# Patient Record
Sex: Male | Born: 2000 | Race: White | Hispanic: No | Marital: Single | Smoking: Current every day smoker
Health system: Southern US, Community
[De-identification: ages and names within clinical notes are randomized; demographics above are authoritative.]

## PROBLEM LIST (undated history)

## (undated) DIAGNOSIS — F909 Attention-deficit hyperactivity disorder, unspecified type: Secondary | ICD-10-CM

## (undated) DIAGNOSIS — H9325 Central auditory processing disorder: Secondary | ICD-10-CM

---
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
## 2021-01-22 ENCOUNTER — Other Ambulatory Visit: Payer: Self-pay

## 2021-01-22 ENCOUNTER — Emergency Department (HOSPITAL_COMMUNITY)
Admission: EM | Admit: 2021-01-22 | Discharge: 2021-01-22 | Disposition: A | Discharge status: Home / Self Care | Payer: Medicaid Other | Attending: Emergency Medicine | Admitting: Emergency Medicine

## 2021-01-22 ENCOUNTER — Encounter (HOSPITAL_COMMUNITY): Payer: Self-pay

## 2021-01-22 ENCOUNTER — Emergency Department (HOSPITAL_COMMUNITY): Payer: Medicaid Other

## 2021-01-22 DIAGNOSIS — R1031 Right lower quadrant pain: Secondary | ICD-10-CM

## 2021-01-22 DIAGNOSIS — K3580 Unspecified acute appendicitis: Secondary | ICD-10-CM | POA: Insufficient documentation

## 2021-01-22 DIAGNOSIS — Z20822 Contact with and (suspected) exposure to covid-19: Secondary | ICD-10-CM | POA: Insufficient documentation

## 2021-01-22 LAB — COMPREHENSIVE METABOLIC PANEL
ALT: 18 U/L (ref 0–44)
AST: 23 U/L (ref 15–41)
Albumin: 4.8 g/dL (ref 3.5–5.0)
Alkaline Phosphatase: 78 U/L (ref 38–126)
Anion gap: 7 (ref 5–15)
BUN: 10 mg/dL (ref 6–20)
CO2: 28 mmol/L (ref 22–32)
Calcium: 9.5 mg/dL (ref 8.9–10.3)
Chloride: 101 mmol/L (ref 98–111)
Creatinine, Ser: 0.87 mg/dL (ref 0.61–1.24)
GFR, Estimated: >60 mL/min (ref >60)
Glucose, Bld: 127 mg/dL — ABNORMAL HIGH (ref 70–99)
Potassium: 3.8 mmol/L (ref 3.5–5.1)
Sodium: 136 mmol/L (ref 135–145)
Total Bilirubin: 1 mg/dL (ref 0.3–1.2)
Total Protein: 7.8 g/dL (ref 6.5–8.1)

## 2021-01-22 LAB — URINALYSIS, ROUTINE W REFLEX MICROSCOPIC
Bacteria, UA: NONE SEEN
Bilirubin Urine: NEGATIVE
Glucose, UA: NEGATIVE mg/dL
Hgb urine dipstick: NEGATIVE
Ketones, ur: NEGATIVE mg/dL
Leukocytes,Ua: NEGATIVE
Nitrite: NEGATIVE
Protein, ur: 30 mg/dL — AB
Specific Gravity, Urine: 1.024 (ref 1.005–1.030)
pH: 7 (ref 5.0–8.0)

## 2021-01-22 LAB — RESP PANEL BY RT-PCR (FLU A&B, COVID) ARPGX2
Influenza A by PCR: NEGATIVE
Influenza B by PCR: NEGATIVE
SARS Coronavirus 2 by RT PCR: NEGATIVE

## 2021-01-22 LAB — LIPASE, BLOOD: Lipase: 27 U/L (ref 11–51)

## 2021-01-22 LAB — CBC
HCT: 45.2 % (ref 39.0–52.0)
Hemoglobin: 15 g/dL (ref 13.0–17.0)
MCH: 29.4 pg (ref 26.0–34.0)
MCHC: 33.2 g/dL (ref 30.0–36.0)
MCV: 88.6 fL (ref 80.0–100.0)
Platelets: 203 10*3/uL (ref 150–400)
RBC: 5.1 MIL/uL (ref 4.22–5.81)
RDW: 12.4 % (ref 11.5–15.5)
WBC: 9.6 10*3/uL (ref 4.0–10.5)
nRBC: 0 % (ref 0.0–0.2)

## 2021-01-22 MED ORDER — SODIUM CHLORIDE 0.9 % IV BOLUS
500.0000 mL | Freq: Once | INTRAVENOUS | Status: AC
  Administered 2021-01-22: 500 mL via INTRAVENOUS

## 2021-01-22 MED ORDER — AMOXICILLIN-POT CLAVULANATE 875-125 MG PO TABS: 1.0000 | ORAL_TABLET | Freq: Two times a day (BID) | ORAL | 0 refills | Status: AC

## 2021-01-22 MED ORDER — IOHEXOL 300 MG/ML  SOLN
100.0000 mL | Freq: Once | INTRAMUSCULAR | Status: AC | PRN
  Administered 2021-01-22: 75 mL via INTRAVENOUS

## 2021-01-22 MED ORDER — DICYCLOMINE HCL 10 MG PO CAPS
10.0000 mg | ORAL_CAPSULE | Freq: Once | ORAL | Status: AC
  Administered 2021-01-22: 10 mg via ORAL
  Filled 2021-01-22: qty 1

## 2021-01-22 NOTE — ED Triage Notes (Signed)
Pt brought in by [facility] with c/o RLQ abdominal pain. Denies n/v. EMS reports pt is anxious and VSS.

## 2021-01-22 NOTE — Discharge Instructions (Addendum)
Your lab work looks reassuring, your imaging reveals possible appendicitis.  It is recommended by our general surgeon that we would antibiotics.  I have started you on Augmentin please take as prescribed.  I recommend a bland diet please read information above please read  You may follow-up with Dr. Lovell Sheehan with general surgery for further evaluation if needed contact info is above  I want you to come back to the emergency department if you develop fevers, chills, severe abdominal pain, uncontrolled nausea, vomiting as these symptoms concerning for appendicitis.  Please allow the medication 24 hours to work if after 24 hours you have these symptoms please come back.

## 2021-01-22 NOTE — ED Triage Notes (Signed)
Pt to er, pt states that he is here for some abd pain, pt c/o RLQ pain, states that the pain started this am after he woke up, states that the pain is better when he hunches over.  Pt denies vomiting or diarrhea, states that he has had a similar episode and it was gas.

## 2021-01-22 NOTE — ED Provider Notes (Signed)
[facility] EMERGENCY DEPARTMENT Provider Note   CSN: [id number] Arrival date & time: 01/22/21  1018     History Chief Complaint  Patient presents with  . Abdominal Pain    Lee Ingram is a 20 y.o. male.  HPI   Patient with no significant medical history presents to the emergency department with chief complaint of right lower quadrant pain, he endorses pain started suddenly this morning, describes it as a sharp sensation in his abdomen, does not radiate, associated with nausea but no vomiting, denies urinary symptoms, constipation or diarrhea.  He denies food making the pain  worse, he tried to go to work today unfortunately pain got unbearable and come here to the emergency department.  He denies systemic infection like fevers or chills, has no significant abdominal history, has never had this in the past.  Patient denies  alleviating factors.  Patient last ate yesterday, is tolerating liquids.  Patient denies headaches, fevers, chills, shortness of breath, chest pain,  vomiting, diarrhea, worsening pedal edema.  Past Medical History:  Diagnosis Date  . ADHD   . Auditory processing disorder     There are no problems to display for this patient.   History reviewed. No pertinent surgical history.     History reviewed. No pertinent family history.  Social History   Tobacco Use  . Smoking status: Never Smoker  . Smokeless tobacco: Never Used  Vaping Use  . Vaping Use: Every day  Substance Use Topics  . Alcohol use: Never  . Drug use: Never    Home Medications Prior to Admission medications   Medication Sig Start Date End Date Taking? Authorizing Provider  amoxicillin-clavulanate (AUGMENTIN) 875-125 MG tablet Take 1 tablet by mouth every 12 (twelve) hours for 5 days. 01/22/21 01/27/21 Yes Marcello Fennel, PA-C    Allergies    Patient has no known allergies.  Review of Systems   Review of Systems  Constitutional: Negative for chills and fever.  HENT:  Negative for congestion and sore throat.   Respiratory: Negative for shortness of breath.   Cardiovascular: Negative for chest pain.  Gastrointestinal: Positive for abdominal pain and nausea. Negative for constipation, diarrhea and vomiting.  Genitourinary: Negative for dysuria, enuresis and flank pain.  Musculoskeletal: Negative for back pain.  Skin: Negative for rash.  Neurological: Negative for headaches.  Hematological: Does not bruise/bleed easily.    Physical Exam Updated Vital Signs BP 119/70 (BP Location: Left Arm)   Pulse (!) 114   Temp 99 F (37.2 C) (Oral)   Resp 18   Ht $R'6\' 1"'sP$  (1.854 m)   Wt 66.6 kg   SpO2 100%   BMI 19.37 kg/m   Physical Exam Vitals and nursing note reviewed.  Constitutional:      General: He is not in acute distress.    Appearance: He is not ill-appearing.  HENT:     Head: Normocephalic and atraumatic.     Nose: No congestion.  Eyes:     Conjunctiva/sclera: Conjunctivae normal.  Cardiovascular:     Rate and Rhythm: Normal rate and regular rhythm.     Pulses: Normal pulses.     Heart sounds: No murmur heard. No friction rub. No gallop.   Pulmonary:     Effort: No respiratory distress.     Breath sounds: No wheezing, rhonchi or rales.  Abdominal:     Palpations: Abdomen is soft.     Tenderness: There is abdominal tenderness. There is no right CVA tenderness, left CVA tenderness  or guarding.     Comments: Patient's abdomen is visualized, is nondistended, normoactive bowel sounds, dull to percussion, he is notably tender in the right lower quadrant, positive McBurney point, negative guarding, rebound tenderness, no CVA tenderness.  Skin:    General: Skin is warm and dry.  Neurological:     Mental Status: He is alert.  Psychiatric:        Mood and Affect: Mood normal.     ED Results / Procedures / Treatments   Labs (all labs ordered are listed, but only abnormal results are displayed) Labs Reviewed  COMPREHENSIVE METABOLIC PANEL -  Abnormal; Notable for the following components:      Result Value   Glucose, Bld 127 (*)    All other components within normal limits  URINALYSIS, ROUTINE W REFLEX MICROSCOPIC - Abnormal; Notable for the following components:   Protein, ur 30 (*)    All other components within normal limits  RESP PANEL BY RT-PCR (FLU A&B, COVID) ARPGX2  LIPASE, BLOOD  CBC    EKG None  Radiology CT ABDOMEN PELVIS W CONTRAST  Result Date: 01/22/2021 CLINICAL DATA:  Onset mid abdominal and right lower quadrant pain this morning. No known injury. EXAM: CT ABDOMEN AND PELVIS WITH CONTRAST TECHNIQUE: Multidetector CT imaging of the abdomen and pelvis was performed using the standard protocol following bolus administration of intravenous contrast. CONTRAST:  75 mL OMNIPAQUE IOHEXOL 300 MG/ML  SOLN COMPARISON:  None. FINDINGS: Lower chest: Lung bases clear.  No pleural or pericardial effusion. Hepatobiliary: No focal liver abnormality is seen. No gallstones, gallbladder wall thickening, or biliary dilatation. Pancreas: Unremarkable. No pancreatic ductal dilatation or surrounding inflammatory changes. Spleen: Normal in size without focal abnormality. Adrenals/Urinary Tract: Adrenal glands are unremarkable. Kidneys are normal, without renal calculi, focal lesion, or hydronephrosis. Bladder is unremarkable. Stomach/Bowel: The appendix is at the upper limits of normal in diameter at 0.6 cm and there is mild stranding about the appendix worrisome for early appendicitis. The stomach and small and large bowel appear normal. Vascular/Lymphatic: No significant vascular findings are present. No enlarged abdominal or pelvic lymph nodes. Reproductive: Prostate is unremarkable. Other: None. Musculoskeletal: No acute or focal abnormality. IMPRESSION: Findings are worrisome but not definitive for early appendicitis. No abscess or perforation. The exam is otherwise negative. Electronically Signed   By: Inge Rise M.D.   On:  01/22/2021 14:34    Procedures Procedures   Medications Ordered in ED Medications  dicyclomine (BENTYL) capsule 10 mg (10 mg Oral Given 01/22/21 1342)  sodium chloride 0.9 % bolus 500 mL (500 mLs Intravenous New Bag/Given 01/22/21 1339)  iohexol (OMNIPAQUE) 300 MG/ML solution 100 mL (75 mLs Intravenous Contrast Given 01/22/21 1404)    ED Course  I have reviewed the triage vital signs and the nursing notes.  Pertinent labs & imaging results that were available during my care of the patient were reviewed by me and considered in my medical decision making (see chart for details).    MDM Rules/Calculators/A&P                         Initial impression-patient presents with right lower quadrant, he is alert, does not appear in acute distress, vital signs notable for tachycardia.  Concern for appendicitis, will obtain basic lab work-up, provide patient with fluids, Bentyl, CT scan and reassess.  Work-up-CBC unremarkable, CMP shows hyperglycemia 127, UA negative for nitrates, leukocytes, hematuria, lipase 27.  CT abdomen pelvis reveals findings worrisome for  but not definitive for early appendicitis no abscess or perforation present.  Reassessment patient reassessed after fluids and Bentyl, states he is feeling better, has no complaints at this time.  Updated him on findings for possible appendicitis.  Will consult with general surgery for further recommendations.  Patient and mother updated on plans, they are agreeable to this, all questions were answered and they are agreeable for discharge at this time.  Consult spoke with Dr. Arnoldo Morale of general surgery, he reviewed imaging and feels patient could be managed with antibiotics on an outpatient setting.  He recommends Augmentin with strict return precautions.  If symptoms worsen he should come back for removal.  He can follow-up with his office in 5 days time if needed.  Rule out-low suspicion for systemic infection as patient is  nontoxic-appearing, no leukocytosis noted on CBC.  Patient is noted to be tachycardic, I suspect this is secondary due to pain as well as whitecoat syndrome as he states he feels nervous being in the emergency department.  Low suspicion for liver or gallbladder abnormality as patient has no right upper quadrant pain, liver enzymes and alk phos within normal limits.  Low suspicion for ruptured stomach ulcer as abdomen is nondistended, dull to percussion, no peritoneal sign present my exam.  Low suspicion for diverticulitis as history is atypical, CT imaging is negative for this.  Plan-  1.  Right lower quadrant pain-suspect secondary due to possible appendicitis, will start him on antibiotics, provide with strict return precautions, follow-up with general surgery if needed.  Vital signs have remained stable, no indication for hospital admission.  Patient discussed with attending and they agreed with assessment and plan.  Patient given at home care as well strict return precautions.  Patient verbalized that they understood agreed to said plan.   Final Clinical Impression(s) / ED Diagnoses Final diagnoses:  Right lower quadrant abdominal pain  Acute appendicitis, unspecified acute appendicitis type    Rx / DC Orders ED Discharge Orders         Ordered    amoxicillin-clavulanate (AUGMENTIN) 875-125 MG tablet  Every 12 hours        01/22/21 1509           Marcello Fennel, PA-C 01/22/21 1603    Milton Ferguson, MD 01/23/21 [phone]

## 2021-02-24 ENCOUNTER — Other Ambulatory Visit: Payer: Self-pay

## 2021-02-24 ENCOUNTER — Encounter (HOSPITAL_COMMUNITY): Payer: Self-pay | Admitting: Emergency Medicine

## 2021-02-24 ENCOUNTER — Emergency Department (HOSPITAL_COMMUNITY)
Admission: EM | Admit: 2021-02-24 | Discharge: 2021-02-24 | Disposition: A | Discharge status: Home / Self Care | Payer: Medicaid Other | Attending: Emergency Medicine | Admitting: Emergency Medicine

## 2021-02-24 ENCOUNTER — Emergency Department (HOSPITAL_COMMUNITY): Payer: Medicaid Other

## 2021-02-24 DIAGNOSIS — Z23 Encounter for immunization: Secondary | ICD-10-CM | POA: Insufficient documentation

## 2021-02-24 DIAGNOSIS — L089 Local infection of the skin and subcutaneous tissue, unspecified: Secondary | ICD-10-CM | POA: Diagnosis not present

## 2021-02-24 DIAGNOSIS — S6992XA Unspecified injury of left wrist, hand and finger(s), initial encounter: Secondary | ICD-10-CM | POA: Diagnosis present

## 2021-02-24 DIAGNOSIS — S61221A Laceration with foreign body of left index finger without damage to nail, initial encounter: Secondary | ICD-10-CM | POA: Diagnosis not present

## 2021-02-24 DIAGNOSIS — Y240XXA Airgun discharge, undetermined intent, initial encounter: Secondary | ICD-10-CM | POA: Diagnosis not present

## 2021-02-24 DIAGNOSIS — R000 Tachycardia, unspecified: Secondary | ICD-10-CM | POA: Insufficient documentation

## 2021-02-24 MED ORDER — LIDOCAINE HCL (PF) 1 % IJ SOLN
5.0000 mL | Freq: Once | INTRAMUSCULAR | Status: AC
  Administered 2021-02-24: 5 mL
  Filled 2021-02-24: qty 30

## 2021-02-24 MED ORDER — CEPHALEXIN 500 MG PO CAPS: 500.0000 mg | ORAL_CAPSULE | Freq: Four times a day (QID) | ORAL | 0 refills | Status: AC

## 2021-02-24 MED ORDER — TETANUS-DIPHTH-ACELL PERTUSSIS 5-2.5-18.5 LF-MCG/0.5 IM SUSY
0.5000 mL | PREFILLED_SYRINGE | Freq: Once | INTRAMUSCULAR | Status: AC
  Administered 2021-02-24: 0.5 mL via INTRAMUSCULAR
  Filled 2021-02-24: qty 0.5
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
## 2021-02-24 NOTE — ED Provider Notes (Signed)
[facility] EMERGENCY DEPARTMENT Provider Note   CSN: [id number] Arrival date & time: 02/24/21  1502     History Chief Complaint  Patient presents with   Finger Injury    [facility] is a 20 y.o. male.  HPI  Patient with no significant medical history presents to the emergency department with chief complaint of left index finger pain.  Patient states today he was playing with his CO2 powered pellet gun and accidentally shot the distal aspect of the second digit of his left hand.  He endorses severe pain after the incident and was able to control the bleeding with direct pressure.  He is unsure whether or not the metal pellet is on his finger, he states he has full range of motion in all of his joints his finger, denies paresthesia or weakness in that finger, he is unsure when his last tetanus shot was.  Patient denies alleviating factors.  Patient denies headaches, fevers, chills, shortness of breath or chest pain.  Past Medical History:  Diagnosis Date   ADHD    Auditory processing disorder     There are no problems to display for this patient.   History reviewed. No pertinent surgical history.     History reviewed. No pertinent family history.  Social History   Tobacco Use   Smoking status: Never   Smokeless tobacco: Never  Vaping Use   Vaping Use: Every day  Substance Use Topics   Alcohol use: Never   Drug use: Never    Home Medications Prior to Admission medications   Medication Sig Start Date End Date Taking? Authorizing Provider  cephALEXin (KEFLEX) 500 MG capsule Take 1 capsule (500 mg total) by mouth 4 (four) times daily for 7 days. 02/24/21 03/03/21 Yes Carroll Sage, PA-C    Allergies    Patient has no known allergies.  Review of Systems   Review of Systems  Constitutional:  Negative for chills and fever.  HENT:  Negative for congestion.   Respiratory:  Negative for shortness of breath.   Cardiovascular:  Negative for chest pain.   Gastrointestinal:  Negative for abdominal pain.  Genitourinary:  Negative for enuresis.  Musculoskeletal:  Negative for back pain.  Skin:  Positive for wound. Negative for rash.       Puncture wound in the second left digit of the hand.  Neurological:  Negative for dizziness.  Hematological:  Does not bruise/bleed easily.   Physical Exam Updated Vital Signs BP 119/80   Pulse 89   Temp 99 F (37.2 C) (Oral)   Resp 18   Ht 6\' 1"  (1.854 m)   Wt 66.7 kg   SpO2 99%   BMI 19.39 kg/m   Physical Exam Vitals and nursing note reviewed.  Constitutional:      General: He is not in acute distress.    Appearance: Normal appearance. He is not ill-appearing or diaphoretic.  HENT:     Head: Normocephalic and atraumatic.     Nose: No congestion or rhinorrhea.  Eyes:     General:        Right eye: No discharge.        Left eye: No discharge.     Conjunctiva/sclera: Conjunctivae normal.  Cardiovascular:     Rate and Rhythm: Regular rhythm. Tachycardia present.  Pulmonary:     Effort: Pulmonary effort is normal.  Musculoskeletal:     Cervical back: Neck supple.     Right lower leg: No edema.  Left lower leg: No edema.     Comments: Patient's left hand was visualized he has a noted puncture wound on the second left digit distal to the DIP joint on the palmar aspect.  It was hemodynamically stable, no surrounding erythema or edema present, patient has full range of motion in his MCP PIP and DIP joint, neurovascular fully intact.  Areas palpated was tender to palpation, no palpable foreign body noted, No ligament tendon damage present.  Skin:    General: Skin is warm and dry.     Coloration: Skin is not jaundiced or pale.  Neurological:     Mental Status: He is alert and oriented to person, place, and time.  Psychiatric:        Mood and Affect: Mood normal.    ED Results / Procedures / Treatments   Labs (all labs ordered are listed, but only abnormal results are displayed) Labs  Reviewed - No data to display  EKG None  Radiology DG Hand Complete Left  Result Date: 02/24/2021 CLINICAL DATA:  Puncture wound EXAM: LEFT HAND - COMPLETE 3+ VIEW COMPARISON:  None. FINDINGS: Frontal, lateral, and oblique views obtained. There is a BB in the soft tissues volar to the second distal phalanx. No other radiopaque foreign body. No fracture or dislocation. Joint spaces appear normal. No erosive change. IMPRESSION: BB in the soft tissues volar to the second distal phalanx. No other radiopaque foreign body. No fracture or dislocation. No evident arthropathy. Electronically Signed   By: Bretta Bang III M.D.   On: 02/24/2021 16:01    Procedures .Foreign Body Removal  Date/Time: 02/24/2021 5:14 PM Performed by: Carroll Sage, PA-C Authorized by: Carroll Sage, PA-C  Consent: Verbal consent obtained. Risks and benefits: risks, benefits and alternatives were discussed Consent given by: patient Patient identity confirmed: verbally with patient Body area: skin General location: upper extremity Location details: left index finger Anesthesia: digital block  Anesthesia: Local Anesthetic: lidocaine 1% without epinephrine  Sedation: Patient sedated: no  Patient restrained: no Patient cooperative: yes Localization method: serial x-rays and probed Removal mechanism: forceps and scalpel Dressing: dressing applied Tendon involvement: none Depth: subcutaneous Complexity: simple 1 objects recovered. Objects recovered: 1 Post-procedure assessment: foreign body removed Patient tolerance: patient tolerated the procedure well with no immediate complications  .Marland KitchenLaceration Repair  Date/Time: 02/24/2021 5:16 PM Performed by: Carroll Sage, PA-C Authorized by: Carroll Sage, PA-C   Consent:    Consent obtained:  Verbal   Consent given by:  Patient   Risks discussed:  Infection, pain, retained foreign body, need for additional repair, poor cosmetic  result, tendon damage, vascular damage, poor wound healing and nerve damage   Alternatives discussed:  No treatment Universal protocol:    Patient identity confirmed:  Verbally with patient Anesthesia:    Anesthesia method:  Local infiltration   Local anesthetic:  Lidocaine 1% w/o epi Laceration details:    Location:  Finger   Finger location:  L index finger   Length (cm):  4   Depth (mm):  2 Pre-procedure details:    Preparation:  Patient was prepped and draped in usual sterile fashion and imaging obtained to evaluate for foreign bodies Exploration:    Limited defect created (wound extended): no     Imaging obtained: x-ray     Imaging outcome: foreign body noted     Wound exploration: wound explored through full range of motion and entire depth of wound visualized     Contaminated: no  Treatment:    Amount of cleaning:  Extensive   Irrigation solution:  Sterile saline   Irrigation method:  Syringe   Visualized foreign bodies/material removed: no     Debridement:  None Skin repair:    Repair method:  Sutures   Suture size:  4-0   Suture material:  Prolene   Suture technique:  Simple interrupted   Number of sutures:  2 Approximation:    Approximation:  Loose Repair type:    Repair type:  Simple Post-procedure details:    Dressing:  Non-adherent dressing   Procedure completion:  Tolerated well, no immediate complications Comments:     After the procedure patient motor, sensation, strength were all intact. area was soft to the touch with good capillary refill.  No signs of infection were noted, no rash, no ligament or tendon damage present.   Medications Ordered in ED Medications  Tdap (BOOSTRIX) injection 0.5 mL (0.5 mLs Intramuscular Given 02/24/21 1547)  lidocaine (PF) (XYLOCAINE) 1 % injection 5 mL (5 mLs Infiltration Given by Other 02/24/21 1641)    ED Course  I have reviewed the triage vital signs and the nursing notes.  Pertinent labs & imaging results that were  available during my care of the patient were reviewed by me and considered in my medical decision making (see chart for details).    MDM Rules/Calculators/A&P                         Initial impression-patient presents with puncture wound On his second digit.  He is alert, does not appear acute stress, vital signs notable for tachycardia.  Will update patient's tetanus shot, obtain imaging for further evaluation.  Work-up-x-ray reveals BB and soft tissue volar to the second distal phalanx.  No fractures or dislocations present.  Reassessment- Will recommend attempt to remove foreign body for improved healing and decrease infection.   Patient is agreeable to this.  Digital block was performed and wound was slightly extended using a scalpel, BB was retrieved.    Due to slightly enlarged laceration will recommend suturing to decrease infection risk and to assist with the healing process.  Patient was agreeable with this and tolerated the procedure well. He received 2 sutures.  Neurovascular was fully intact after the procedure.  Rule out-Low suspicion for fracture or dislocation as x-ray does not reveal any acute findings. low suspicion for ligament or tendon damage as area was palpated no gross defects noted, e had full range of motion in his left index finger in all joints.  low suspicion for compartment syndrome as area was palpated it was soft to the touch, neurovascular fully intact before and after the procedure  Plan-  Foreign body left index finger-foreign body was removed, patient received 2 sutures to approximate wound.  Patient started on antibiotics, have him follow-up in the next 12 to 14 days have sutures removed.  Vital signs have remained stable, no indication for hospital admission.  Patient given at home care as well strict return precautions.  Patient verbalized that they understood agreed to said plan.  Final Clinical Impression(s) / ED Diagnoses Final diagnoses:  Foreign body  of finger with infection, initial encounter    Rx / DC Orders ED Discharge Orders          Ordered    cephALEXin (KEFLEX) 500 MG capsule  4 times daily        02/24/21 1637  Carroll Sage, PA-C 02/24/21 Myrtie Cruise, MD 02/25/21 1016

## 2021-08-15 ENCOUNTER — Emergency Department (HOSPITAL_COMMUNITY)
Admission: EM | Admit: 2021-08-15 | Discharge: 2021-08-15 | Disposition: A | Discharge status: Home / Self Care | Payer: Medicaid Other | Attending: Emergency Medicine | Admitting: Emergency Medicine

## 2021-08-15 ENCOUNTER — Other Ambulatory Visit: Payer: Self-pay

## 2021-08-15 ENCOUNTER — Encounter (HOSPITAL_COMMUNITY): Payer: Self-pay

## 2021-08-15 DIAGNOSIS — R002 Palpitations: Secondary | ICD-10-CM | POA: Insufficient documentation

## 2021-08-15 DIAGNOSIS — R000 Tachycardia, unspecified: Secondary | ICD-10-CM | POA: Diagnosis not present

## 2021-08-15 LAB — BASIC METABOLIC PANEL
Anion gap: 7 (ref 5–15)
BUN: 14 mg/dL (ref 6–20)
CO2: 26 mmol/L (ref 22–32)
Calcium: 9.3 mg/dL (ref 8.9–10.3)
Chloride: 104 mmol/L (ref 98–111)
Creatinine, Ser: 0.82 mg/dL (ref 0.61–1.24)
GFR, Estimated: >60 mL/min (ref >60)
Glucose, Bld: 117 mg/dL — ABNORMAL HIGH (ref 70–99)
Potassium: 4.1 mmol/L (ref 3.5–5.1)
Sodium: 137 mmol/L (ref 135–145)

## 2021-08-15 LAB — CBC WITH DIFFERENTIAL/PLATELET
Abs Immature Granulocytes: 0.02 10*3/uL (ref 0.00–0.07)
Basophils Absolute: 0 10*3/uL (ref 0.0–0.1)
Basophils Relative: 0 %
Eosinophils Absolute: 0 10*3/uL (ref 0.0–0.5)
Eosinophils Relative: 0 %
HCT: 45.3 % (ref 39.0–52.0)
Hemoglobin: 15.6 g/dL (ref 13.0–17.0)
Immature Granulocytes: 0 %
Lymphocytes Relative: 11 %
Lymphs Abs: 0.9 10*3/uL (ref 0.7–4.0)
MCH: 30.3 pg (ref 26.0–34.0)
MCHC: 34.4 g/dL (ref 30.0–36.0)
MCV: 88 fL (ref 80.0–100.0)
Monocytes Absolute: 0.4 10*3/uL (ref 0.1–1.0)
Monocytes Relative: 4 %
Neutro Abs: 7 10*3/uL (ref 1.7–7.7)
Neutrophils Relative %: 85 %
Platelets: 205 10*3/uL (ref 150–400)
RBC: 5.15 MIL/uL (ref 4.22–5.81)
RDW: 11.8 % (ref 11.5–15.5)
WBC: 8.3 10*3/uL (ref 4.0–10.5)
nRBC: 0 % (ref 0.0–0.2)

## 2021-08-15 LAB — TSH: TSH: 0.422 u[IU]/mL (ref 0.350–4.500)

## 2021-08-15 MED ORDER — LORAZEPAM 1 MG PO TABS
1.0000 mg | ORAL_TABLET | Freq: Once | ORAL | Status: AC
  Administered 2021-08-15: 1 mg via ORAL
  Filled 2021-08-15: qty 1

## 2021-08-15 MED ORDER — LORAZEPAM 2 MG/ML IJ SOLN: 1.0000 mg | Freq: Once | INTRAMUSCULAR | Status: DC

## 2021-08-15 MED ORDER — SODIUM CHLORIDE 0.9 % IV BOLUS: 1000.0000 mL | Freq: Once | INTRAVENOUS | Status: DC

## 2021-08-15 NOTE — Discharge Instructions (Signed)
Drink plenty of fluids and get plenty of rest.  Avoid caffeine, alcohol, or stimulant use.  Return to the emergency department for chest pain, difficulty breathing, or other new and concerning symptoms.

## 2021-08-15 NOTE — ED Provider Notes (Signed)
[facility] EMERGENCY DEPARTMENT Provider Note   CSN: [id number] Arrival date & time: 08/15/21  0444     History Chief Complaint  Patient presents with   Palpitations    Karell Tukes is a 20 y.o. male.  Patient is a 20 year old male with past medical history of ADHD.  Patient presenting along with his mother for evaluation of palpitations.  Was at home this evening when he felt his heart began to race.  He woke up his mother telling her that something felt wrong and mom brings him for evaluation.  Patient arrives here with sinus tachycardia, but otherwise stable vital signs.  He denies any excessive caffeine, alcohol, or illicit drug use.  He denies any chest pain.  COVID shortness of breath, fevers, chills, or cough.  He denies any swelling in his legs or calf pain.  Mother does offer that he has had significant stress in his life recently related to the recent death of his stepfather as well as work stress.  The history is provided by the patient.  Palpitations Palpitations quality:  Regular Onset quality:  Sudden Duration:  1 hour Timing:  Constant Progression:  Worsening Chronicity:  New Context: anxiety   Relieved by:  Nothing Worsened by:  Nothing     Past Medical History:  Diagnosis Date   ADHD    Auditory processing disorder     There are no problems to display for this patient.   History reviewed. No pertinent surgical history.     No family history on file.  Social History   Tobacco Use   Smoking status: Never   Smokeless tobacco: Never  Vaping Use   Vaping Use: Every day  Substance Use Topics   Alcohol use: Never   Drug use: Never    Home Medications Prior to Admission medications   Not on File    Allergies    Patient has no known allergies.  Review of Systems   Review of Systems  Cardiovascular:  Positive for palpitations.  All other systems reviewed and are negative.  Physical Exam Updated Vital Signs BP 134/83   Pulse (!)  129   Temp 98.4 F (36.9 C) (Oral)   Resp 16   Ht 6\' 1"  (1.854 m)   Wt 66.7 kg   SpO2 100%   BMI 19.40 kg/m   Physical Exam Vitals and nursing note reviewed.  Constitutional:      General: He is not in acute distress.    Appearance: He is well-developed. He is not diaphoretic.  HENT:     Head: Normocephalic and atraumatic.  Cardiovascular:     Rate and Rhythm: Regular rhythm. Tachycardia present.     Heart sounds: No murmur heard.   No friction rub.  Pulmonary:     Effort: Pulmonary effort is normal. No respiratory distress.     Breath sounds: Normal breath sounds. No wheezing or rales.  Abdominal:     General: Bowel sounds are normal. There is no distension.     Palpations: Abdomen is soft.     Tenderness: There is no abdominal tenderness.  Musculoskeletal:        General: No swelling or tenderness. Normal range of motion.     Cervical back: Normal range of motion and neck supple.     Right lower leg: No edema.     Left lower leg: No edema.  Skin:    General: Skin is warm and dry.  Neurological:     Mental Status: He  is alert and oriented to person, place, and time.     Coordination: Coordination normal.    ED Results / Procedures / Treatments   Labs (all labs ordered are listed, but only abnormal results are displayed) Labs Reviewed - No data to display  EKG EKG Interpretation  Date/Time:  Friday August 15 2021 05:05:46 EST Ventricular Rate:  121 PR Interval:  164 QRS Duration: 99 QT Interval:  314 QTC Calculation: 446 R Axis:   200 Text Interpretation: Sinus tachycardia LAE, consider biatrial enlargement Probable RVH w/ secondary repol abnormality Confirmed by Geoffery Lyons ([id number]) on 08/15/2021 5:14:18 AM  Radiology No results found.  Procedures Procedures   Medications Ordered in ED Medications  sodium chloride 0.9 % bolus 1,000 mL (has no administration in time range)  LORazepam (ATIVAN) injection 1 mg (has no administration in time range)     ED Course  I have reviewed the triage vital signs and the nursing notes.  Pertinent labs & imaging results that were available during my care of the patient were reviewed by me and considered in my medical decision making (see chart for details).    MDM Rules/Calculators/A&P  Patient brought by mom for evaluation of tachycardia.  This began this evening.  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  He appears very comfortable and in no distress.  He does describe excessive stress between work and the death of his stepfather.  He denies to me he has used any alcohol, caffeine, or illicit drugs.  He has been observed here in the ER for 2 hours.  He was given oral Ativan and oral hydration.  His laboratory studies have returned unremarkable with TSH pending.  At this point, patient was reevaluated and heart rate was below 110.  I feel as though he can safely be discharged with instructions to return if things worsen.  He is to follow-up with primary doctor.  Final Clinical Impression(s) / ED Diagnoses Final diagnoses:  None    Rx / DC Orders ED Discharge Orders     None        Geoffery Lyons, MD 08/15/21 [phone]

## 2021-08-15 NOTE — ED Notes (Signed)
Pt given 2 cups of water for PO hydration

## 2021-08-15 NOTE — ED Triage Notes (Signed)
Pt says he was at home relaxing when he suddenly felt his heart start racing and having some lightheadedness. Pt denies any pain. No fever. Reports drinking fluids today as normal.

## 2021-11-27 ENCOUNTER — Ambulatory Visit (INDEPENDENT_AMBULATORY_CARE_PROVIDER_SITE_OTHER): Payer: Medicaid Other | Admitting: Gastroenterology

## 2021-12-06 ENCOUNTER — Emergency Department (HOSPITAL_COMMUNITY)
Admission: EM | Admit: 2021-12-06 | Discharge: 2021-12-06 | Disposition: A | Discharge status: Home / Self Care | Payer: Medicaid Other | Attending: Emergency Medicine | Admitting: Emergency Medicine

## 2021-12-06 ENCOUNTER — Other Ambulatory Visit: Payer: Self-pay

## 2021-12-06 ENCOUNTER — Encounter (HOSPITAL_COMMUNITY): Payer: Self-pay

## 2021-12-06 ENCOUNTER — Emergency Department (HOSPITAL_COMMUNITY): Payer: Medicaid Other

## 2021-12-06 DIAGNOSIS — R109 Unspecified abdominal pain: Secondary | ICD-10-CM | POA: Diagnosis present

## 2021-12-06 LAB — URINALYSIS, ROUTINE W REFLEX MICROSCOPIC
Bilirubin Urine: NEGATIVE
Glucose, UA: NEGATIVE mg/dL
Hgb urine dipstick: NEGATIVE
Ketones, ur: NEGATIVE mg/dL
Leukocytes,Ua: NEGATIVE
Nitrite: NEGATIVE
Protein, ur: NEGATIVE mg/dL
Specific Gravity, Urine: 1.024 (ref 1.005–1.030)
pH: 7 (ref 5.0–8.0)

## 2021-12-06 MED ORDER — ACETAMINOPHEN 500 MG PO TABS
1000.0000 mg | ORAL_TABLET | Freq: Once | ORAL | Status: AC
  Administered 2021-12-06: 1000 mg via ORAL
  Filled 2021-12-06: qty 2

## 2021-12-06 NOTE — Discharge Instructions (Addendum)
It was our pleasure to provide your ER care today - we hope that you feel better. ? ?Follow up with primary care doctor in the next couple weeks.  As recurrent abd pain/symptoms for past year, also follow up with GI doctor in the next few weeks - call office to arrange appointment.  ? ?If constipated, make sure to stay well hydrated/drink plenty of fluids, get adequate fiber in diet, take colace (stool softener) 2x/day, and take miralax (laxative) once per day as need - these meds are available over the counter.  ? ?Return to ER if worse, new symptoms, fevers, worsening/severe pain, persistent vomiting, or other concern.  ?

## 2021-12-06 NOTE — ED Notes (Signed)
Pt ambulated to the bathroom unassisted.  

## 2021-12-06 NOTE — ED Provider Notes (Signed)
?[facility] EMERGENCY DEPARTMENT ?Provider Note ? ? ?CSN: [id number] ?Arrival date & time: 12/06/21  0423 ? ?  ? ?History ? ?Chief Complaint  ?Patient presents with  ? Abdominal Pain  ? ? ?Lee Ingram is a 21 y.o. male. ? ?Pt c/o right sided abd/flank pain in past several months. Symptoms occur at rest, acute onset, moderate, non radiating, dull to sharp, without specific exacerbating or alleviating factors. No hematuria or dysuria. No hx gallstones or kidney stones. No scrotal or testicular pain. No fever or chills.  ? ?The history is provided by the patient, a relative and medical records.  ?Abdominal Pain ?Associated symptoms: no chest pain, no diarrhea, no dysuria, no fever, no hematuria, no shortness of breath and no vomiting   ? ?  ? ?Home Medications ?Prior to Admission medications   ?Not on File  ?   ? ?Allergies    ?Patient has no known allergies.   ? ?Review of Systems   ?Review of Systems  ?Constitutional:  Negative for fever.  ?Respiratory:  Negative for shortness of breath.   ?Cardiovascular:  Negative for chest pain.  ?Gastrointestinal:  Positive for abdominal pain. Negative for diarrhea and vomiting.  ?Genitourinary:  Negative for dysuria, hematuria and testicular pain.  ? ?Physical Exam ?Updated Vital Signs ?BP 135/79   Pulse 87   Temp 98.5 ?F (36.9 ?C) (Oral)   Resp 18   Ht 1.854 m (6\' 1" )   Wt 61.7 kg   SpO2 100%   BMI 17.94 kg/m?  ?Physical Exam ?Vitals and nursing note reviewed.  ?Constitutional:   ?   Appearance: Normal appearance. He is well-developed.  ?HENT:  ?   Head: Atraumatic.  ?   Nose: Nose normal.  ?   Mouth/Throat:  ?   Mouth: Mucous membranes are moist.  ?Eyes:  ?   General: No scleral icterus. ?   Conjunctiva/sclera: Conjunctivae normal.  ?Neck:  ?   Trachea: No tracheal deviation.  ?Cardiovascular:  ?   Rate and Rhythm: Normal rate.  ?   Pulses: Normal pulses.  ?Pulmonary:  ?   Effort: Pulmonary effort is normal. No accessory muscle usage or respiratory distress.   ?Abdominal:  ?   General: Bowel sounds are normal. There is no distension.  ?   Palpations: Abdomen is soft. There is no mass.  ?   Tenderness: There is no abdominal tenderness. There is no guarding or rebound.  ?   Hernia: No hernia is present.  ?Genitourinary: ?   Comments: No cva tenderness. Normal external gu exam, no scrotal or testicular pain, swelling or tenderness.  ?Musculoskeletal:     ?   General: No swelling.  ?   Cervical back: Neck supple.  ?Skin: ?   General: Skin is warm and dry.  ?   Findings: No rash.  ?Neurological:  ?   Mental Status: He is alert.  ?   Comments: Alert, speech clear.   ?Psychiatric:     ?   Mood and Affect: Mood normal.  ? ? ?ED Results / Procedures / Treatments   ?Labs ?(all labs ordered are listed, but only abnormal results are displayed) ?Labs Reviewed - No data to display ? ?EKG ?None ? ?Radiology ?CT Renal Stone Study ? ?Result Date: 12/06/2021 ?CLINICAL DATA:  Right flank and right lower quadrant pain. EXAM: CT ABDOMEN AND PELVIS WITHOUT CONTRAST TECHNIQUE: Multidetector CT imaging of the abdomen and pelvis was performed following the standard protocol without IV contrast. RADIATION DOSE REDUCTION: This  exam was performed according to the departmental dose-optimization program which includes automated exposure control, adjustment of the mA and/or kV according to patient size and/or use of iterative reconstruction technique. COMPARISON:  CT with IV contrast 01/22/2021 FINDINGS: Lower chest: No acute abnormality. Hepatobiliary: No focal liver abnormality is seen . No gallstones, gallbladder wall thickening, or biliary dilatation. Without contrast Pancreas: Unremarkable without contrast. Spleen: Normal in size and unremarkable without contrast. Adrenals/Urinary Tract: There is no adrenal mass. There is no evidence of urinary stones or obstruction, no focal abnormality in the unenhanced renal cortex is seen. The bladder is contracted and poorly visualized. There is no  perivesical edema. Stomach/Bowel: There are thickened folds in the stomach and the left upper to mid abdominal small bowel consistent with gastroenteritis without evidence of small-bowel obstruction or inflammatory change. The appendix is normal. There is moderate retained stool in the ascending colon. There is wall thickening or nondistention in the descending segment. There are sigmoid diverticula without evidence of diverticulitis, and additional moderate dense stool in the rectum. Vascular/Lymphatic: No significant vascular findings are present. No enlarged abdominal or pelvic lymph nodes. Reproductive: Normal prostate size and shape. Both testicles are in the scrotal sac and no hydrocele is seen. Other: There is no free air, hemorrhage or fluid. There is no incarcerated hernia. Musculoskeletal: Mild lumbar levoscoliosis. No acute or significant osseous findings. IMPRESSION: 1. Gastroenteritis without evidence of small-bowel obstruction or inflammation. 2. Descending colitis versus nondistention. 3. No evidence of urinary stones or obstruction. 4. Constipation ascending colon, rectum. Electronically Signed   By: Almira Bar M.D.   On: 12/06/2021 06:47   ? ?Procedures ?Procedures  ? ? ?Medications Ordered in ED ?Medications  ?acetaminophen (TYLENOL) tablet 1,000 mg (has no administration in time range)  ? ? ?ED Course/ Medical Decision Making/ A&P ?  ?                        ?Medical Decision Making ?Problems Addressed: ?Right sided abdominal pain: acute illness or injury with systemic symptoms that poses a threat to life or bodily functions ? ?Amount and/or Complexity of Data Reviewed ?Independent Historian: parent ?   Details: hx ?External Data Reviewed: notes. ?Labs: ordered. Decision-making details documented in ED Course. ?Radiology: ordered and independent interpretation performed. Decision-making details documented in ED Course. ? ?Risk ?OTC drugs. ? ? ?Labs and imaging ordered.  ? ?Cardiac monitor:  sinus rhythm, rate 88.  ? ?Labs reviewed/interpreted by me - ua neg for uti or blood.  ? ?CT reviewed/interpreted by me - ?constipation, no sbo.  ? ?Acetaminophen po. ? ?Abd soft non tender. As symptoms recurrent x many months, will rec outpatient pcp/gi f/u.  Vitals normal. Abd soft nt. No nvd in ED.  ? ?Pt appears stable for d/c.  ? ?Rec pcp f/u. ? ? ? ? ? ? ? ? ? ?Final Clinical Impression(s) / ED Diagnoses ?Final diagnoses:  ?None  ? ? ?Rx / DC Orders ?ED Discharge Orders   ? ? None  ? ?  ? ? ?  ?Cathren Laine, MD ?12/06/21 0700 ? ?

## 2021-12-06 NOTE — ED Triage Notes (Signed)
Pt comes in with mom from home with c/o abd pain that has been intermittent since 01/2021. Pt reports last BM was at midnight and reported normal. Denies any other sx.  ?

## 2021-12-06 NOTE — ED Notes (Signed)
Patient transported to CT 

## 2021-12-17 ENCOUNTER — Emergency Department (HOSPITAL_COMMUNITY)
Admission: EM | Admit: 2021-12-17 | Discharge: 2021-12-18 | Disposition: A | Discharge status: Home / Self Care | Payer: Medicaid Other | Attending: Student | Admitting: Student

## 2021-12-17 ENCOUNTER — Encounter (HOSPITAL_COMMUNITY): Payer: Self-pay | Admitting: Emergency Medicine

## 2021-12-17 DIAGNOSIS — F410 Panic disorder [episodic paroxysmal anxiety] without agoraphobia: Secondary | ICD-10-CM

## 2021-12-17 DIAGNOSIS — F419 Anxiety disorder, unspecified: Secondary | ICD-10-CM | POA: Diagnosis not present

## 2021-12-17 DIAGNOSIS — R200 Anesthesia of skin: Secondary | ICD-10-CM | POA: Diagnosis present

## 2021-12-17 DIAGNOSIS — R202 Paresthesia of skin: Secondary | ICD-10-CM | POA: Diagnosis not present

## 2021-12-17 DIAGNOSIS — R000 Tachycardia, unspecified: Secondary | ICD-10-CM | POA: Insufficient documentation

## 2021-12-17 NOTE — ED Triage Notes (Signed)
Pt c/o blurred vision that start about 11pm that has resolved. Pt states then he began to have left leg numbness and tingling which has also resolved. Now pt c/o left arm and hand tingling and numb. ? ?Pt obviously anxious at this time.  ?

## 2021-12-18 MED ORDER — HYDROXYZINE HCL 25 MG PO TABS: 25.0000 mg | ORAL_TABLET | Freq: Four times a day (QID) | ORAL | 0 refills | Status: DC

## 2021-12-18 MED ORDER — HYDROXYZINE HCL 25 MG PO TABS: 25.0000 mg | ORAL_TABLET | Freq: Three times a day (TID) | ORAL | 0 refills | Status: AC | PRN

## 2021-12-18 MED ORDER — LORAZEPAM 2 MG/ML IJ SOLN
1.0000 mg | Freq: Once | INTRAMUSCULAR | Status: AC
  Administered 2021-12-18: 1 mg via INTRAVENOUS
  Filled 2021-12-18: qty 1

## 2021-12-18 NOTE — ED Provider Notes (Signed)
?[facility] EMERGENCY DEPARTMENT ?Provider Note ? ?CSN: [id number] ?Arrival date & time: 12/17/21 2336 ? ?Chief Complaint(s) ?Numbness ? ?HPI ?Lee Ingram is a 21 y.o. male with PMH ADHD, anxiety who presents emergency department for evaluation of numbness.  Patient states that this evening he had an acute onset of palpitations, left hand tingling, left leg tingling that have all resolved.  Patient arrives very tearful and tachycardic stating that he has anxiety and over the last year has had a lot of difficulty processing his grief after his father died.  He has no current psychiatrist or psychologist.  On my arrival, he endorses no neurologic complaints including numbness, tingling, weakness or other systemic symptoms. ? ?HPI ? ?Past Medical History ?Past Medical History:  ?Diagnosis Date  ? ADHD   ? Auditory processing disorder   ? ?There are no problems to display for this patient. ? ?Home Medication(s) ?Prior to Admission medications   ?Not on File  ?                                                                                                                                  ?Past Surgical History ?History reviewed. No pertinent surgical history. ?Family History ?History reviewed. No pertinent family history. ? ?Social History ?Social History  ? ?Tobacco Use  ? Smoking status: Never  ? Smokeless tobacco: Never  ?Vaping Use  ? Vaping Use: Former  ?Substance Use Topics  ? Alcohol use: Never  ? Drug use: Never  ? ?Allergies ?Patient has no known allergies. ? ?Review of Systems ?Review of Systems  ?Neurological:  Positive for numbness.  ?Psychiatric/Behavioral:  The patient is nervous/anxious.   ? ?Physical Exam ?Vital Signs  ?I have reviewed the triage vital signs ?BP 134/87 (BP Location: Right Arm)   Pulse (!) 123   Temp 98 ?F (36.7 ?C) (Oral)   Resp 20   Ht 6\' 1"  (1.854 m)   Wt 61.7 kg   SpO2 98%   BMI 17.94 kg/m?  ? ?Physical Exam ?Vitals and nursing note reviewed.  ?Constitutional:   ?   General:  He is not in acute distress. ?   Appearance: He is well-developed.  ?HENT:  ?   Head: Normocephalic and atraumatic.  ?Eyes:  ?   Conjunctiva/sclera: Conjunctivae normal.  ?Cardiovascular:  ?   Rate and Rhythm: Regular rhythm. Tachycardia present.  ?   Heart sounds: No murmur heard. ?Pulmonary:  ?   Effort: Pulmonary effort is normal. No respiratory distress.  ?   Breath sounds: Normal breath sounds.  ?Abdominal:  ?   Palpations: Abdomen is soft.  ?   Tenderness: There is no abdominal tenderness.  ?Musculoskeletal:     ?   General: No swelling.  ?   Cervical back: Neck supple.  ?Skin: ?   General: Skin is warm and dry.  ?   Capillary Refill: Capillary refill takes less than 2 seconds.  ?Neurological:  ?  Mental Status: He is alert.  ?   Cranial Nerves: No cranial nerve deficit.  ?   Sensory: No sensory deficit.  ?Psychiatric:  ?   Comments: Very anxious and tearful  ? ? ?ED Results and Treatments ?Labs ?(all labs ordered are listed, but only abnormal results are displayed) ?Labs Reviewed - No data to display                                                                                                                       ? ?Radiology ?No results found. ? ?Pertinent labs & imaging results that were available during my care of the patient were reviewed by me and considered in my medical decision making (see MDM for details). ? ?Medications Ordered in ED ?Medications  ?LORazepam (ATIVAN) injection 1 mg (has no administration in time range)  ?                                                               ?                                                                    ?Procedures ?Procedures ? ?(including critical care time) ? ?Medical Decision Making / ED Course ? ? ?This patient presents to the ED for concern of paresthesias and anxiety, this involves an extensive number of treatment options, and is a complaint that carries with it a high risk of complications and morbidity.  The differential diagnosis includes  panic attack, illicit substance use, electrolyte abnormality ? ?MDM: ?Patient seen emergency department for evaluation of anxiety and paresthesias.  Physical exam reveals a very anxious appearing tearful patient with no focal motor or sensory deficits, no cranial nerve deficits.  Paresthesias have resolved but his anxiety remains.  Patient given Ativan in the setting of a likely panic attack.  On reevaluation he states his symptoms have significantly improved.  He was observed for approximately 1-1/2 hours and his heart rate improved on reevaluation.  He was given a prescription for Atarax and instructed to present to the behavioral health urgent care tomorrow to discuss his underlying anxiety.  We used shared decision making to forego laboratory or imaging evaluation at this time as his presentation is consistent with panic disorder and as all of his symptoms have resolved prior to evaluation in the emergency department, suspect hyperventilation as the source of his hand, perioral and leg tingling.  [facility]  ? ? ?Additional history obtained: ?-Additional history obtained from mother ?-External records from  outside source obtained and reviewed including: Chart review including previous notes, labs, imaging, consultation notes ? ? ?Lab Tests: ?-I ordered, reviewed, and interpreted labs.   ?The pertinent results include:   ?Labs Reviewed - No data to display  ? ? ? ? ?Medicines ordered and prescription drug management: ?Meds ordered this encounter  ?Medications  ? LORazepam (ATIVAN) injection 1 mg  ?  ?-I have reviewed the patients home medicines and have made adjustments as needed ? ?Critical interventions ?none ? ? ?Cardiac Monitoring: ?The patient was maintained on a cardiac monitor.  I personally viewed and interpreted the cardiac monitored which showed an underlying rhythm of: Sinus tachycardia, normal sinus rhythm ? ?Social Determinants of Health:  ?Factors impacting patients  care include: Significant underlying anxiety likely related to the death of his father ? ? ?Reevaluation: ?After the interventions noted above, I reevaluated the patient and found that they have :improved ? ?Co morbidities that complicate the patient evaluation ? ?Past Medical History:  ?Diagnosis Date  ? ADHD   ? Auditory processing disorder   ?  ? ? ?Dispostion: ?I considered admission for this patient, but as his symptoms have improved after treatment for panic attack with Ativan, suspect panic disorder and patient is safe for discharge with [facility] follow-up tomorrow ? ? ? ? ?Final Clinical Impression(s) / ED Diagnoses ?Final diagnoses:  ?None  ? ? ? ?@PCDICTATION @ ? ?  ? , MD ?12/18/21 [phone] ? ?

## 2022-01-15 ENCOUNTER — Other Ambulatory Visit: Payer: Self-pay

## 2022-01-15 ENCOUNTER — Emergency Department (HOSPITAL_COMMUNITY)
Admission: EM | Admit: 2022-01-15 | Discharge: 2022-01-16 | Disposition: A | Discharge status: Home / Self Care | Payer: Medicaid Other | Attending: Emergency Medicine | Admitting: Emergency Medicine

## 2022-01-15 ENCOUNTER — Encounter (HOSPITAL_COMMUNITY): Payer: Self-pay

## 2022-01-15 DIAGNOSIS — R079 Chest pain, unspecified: Secondary | ICD-10-CM | POA: Diagnosis not present

## 2022-01-15 NOTE — ED Triage Notes (Signed)
Pov from home with cc of intermittent chest pains for over a month. Says nothing makes it better or worse. Has  not seen pcp. Stabbing pain in the center chest.  ?

## 2022-01-16 ENCOUNTER — Emergency Department (HOSPITAL_COMMUNITY): Payer: Medicaid Other

## 2022-01-16 LAB — I-STAT CHEM 8, ED
BUN: 13 mg/dL (ref 6–20)
Calcium, Ion: 1.21 mmol/L (ref 1.15–1.40)
Chloride: 100 mmol/L (ref 98–111)
Creatinine, Ser: 0.9 mg/dL (ref 0.61–1.24)
Glucose, Bld: 111 mg/dL — ABNORMAL HIGH (ref 70–99)
HCT: 47 % (ref 39.0–52.0)
Hemoglobin: 16 g/dL (ref 13.0–17.0)
Potassium: 3.7 mmol/L (ref 3.5–5.1)
Sodium: 141 mmol/L (ref 135–145)
TCO2: 28 mmol/L (ref 22–32)

## 2022-01-16 NOTE — ED Provider Notes (Signed)
?[facility] ?Provider Note ? ? ?CSN: [id number] ?Arrival date & time: 01/15/22  2336 ? ?  ? ?History ? ?Chief Complaint  ?Patient presents with  ? Chest Pain  ? ? ?Lee Ingram is a 21 y.o. male. ? ?21 yo M who presents to ED with chest pain.  He describes it as a sharp shooting pain happens 20-30 times a day for the last couple weeks.  No history of same.  Sometimes he is short of breath but not really associate with the chest pain.  No nausea, vomiting, diaphoresis or lightheadedness.  He does have a history of anxiety has been treated for that more recently but he states this is nothing like that.  Does not radiate.  Sometimes is worse when taking a deep breath sometimes does not.  No cough.  No fevers.  No back pain.  No abdominal discomfort ? ? ?Chest Pain ? ?  ? ?Home Medications ?Prior to Admission medications   ?Medication Sig Start Date End Date Taking? Authorizing Provider  ?hydrOXYzine (ATARAX) 25 MG tablet Take 1 tablet (25 mg total) by mouth every 8 (eight) hours as needed for anxiety. 12/18/21   Kommor, Debe Coder, MD  ?   ? ?Allergies    ?Patient has no known allergies.   ? ?Review of Systems   ?Review of Systems  ?Cardiovascular:  Positive for chest pain.  ? ?Physical Exam ?Updated Vital Signs ?BP (!) 136/95   Pulse (!) 104   Temp 98.8 ?F (37.1 ?C)   Resp (!) 22   Ht 5\' 11"  (1.803 m)   Wt 62.1 kg   SpO2 100%   BMI 19.11 kg/m?  ?Physical Exam ?Vitals and nursing note reviewed.  ?Constitutional:   ?   Appearance: He is well-developed.  ?HENT:  ?   Head: Normocephalic and atraumatic.  ?Cardiovascular:  ?   Rate and Rhythm: Normal rate.  ?Pulmonary:  ?   Effort: Pulmonary effort is normal. No respiratory distress.  ?Chest:  ?   Chest wall: No mass or deformity.  ?Abdominal:  ?   General: There is no distension.  ?   Palpations: Abdomen is soft.  ?Musculoskeletal:     ?   General: Normal range of motion.  ?   Cervical back: Normal range of motion.  ?Neurological:  ?   Mental  Status: He is alert.  ? ? ?ED Results / Procedures / Treatments   ?Labs ?(all labs ordered are listed, but only abnormal results are displayed) ?Labs Reviewed  ?I-STAT CHEM 8, ED - Abnormal; Notable for the following components:  ?    Result Value  ? Glucose, Bld 111 (*)   ? All other components within normal limits  ? ? ?EKG ?EKG Interpretation ? ?Date/Time:  Thursday Jan 15 2022 23:48:14 EDT ?Ventricular Rate:  98 ?PR Interval:  165 ?QRS Duration: 97 ?QT Interval:  347 ?QTC Calculation: 443 ?R Axis:   139 ?Text Interpretation: Sinus rhythm Probable left atrial enlargement Consider right ventricular hypertrophy ST elev, probable normal early repol pattern Confirmed by Merrily Pew [phone]) on 01/16/2022 12:29:28 AM ? ?Radiology ?DG Chest 2 View ? ?Result Date: 01/16/2022 ?CLINICAL DATA:  Pain. EXAM: CHEST - 2 VIEW COMPARISON:  None Available. FINDINGS: The heart size and mediastinal contours are within normal limits. Both lungs are clear. The visualized skeletal structures are unremarkable. IMPRESSION: No active cardiopulmonary disease. Electronically Signed   By: Ronney Asters M.D.   On: 01/16/2022 00:48   ? ?Procedures ?Procedures  ? ? ?  Medications Ordered in ED ?Medications - No data to display ? ?ED Course/ Medical Decision Making/ A&P ?  ?                        ?Medical Decision Making ?Amount and/or Complexity of Data Reviewed ?Radiology: ordered. ? ? ?Exam reassuring. Cxr reassurign. ECG reassuring. Labs reassuring. Unclear on etiology but at this time seems inconsistent with ACS, PE, pneumonia or other emergent etiologies. Will treat with nsaids for possible pleurisy and pcp follow up.  ? ?Final Clinical Impression(s) / ED Diagnoses ?Final diagnoses:  ?Nonspecific chest pain  ? ? ?Rx / DC Orders ?ED Discharge Orders   ? ? None  ? ?  ? ? ?  ?Merrily Pew, MD ?01/16/22 0407 ? ?

## 2022-01-20 ENCOUNTER — Ambulatory Visit (INDEPENDENT_AMBULATORY_CARE_PROVIDER_SITE_OTHER): Payer: Medicaid Other | Admitting: Gastroenterology

## 2022-02-03 ENCOUNTER — Ambulatory Visit (INDEPENDENT_AMBULATORY_CARE_PROVIDER_SITE_OTHER): Payer: Medicaid Other | Admitting: Gastroenterology

## 2022-02-03 ENCOUNTER — Encounter (INDEPENDENT_AMBULATORY_CARE_PROVIDER_SITE_OTHER): Payer: Self-pay | Admitting: Gastroenterology

## 2022-03-20 ENCOUNTER — Other Ambulatory Visit: Payer: Self-pay

## 2022-03-20 ENCOUNTER — Encounter (HOSPITAL_COMMUNITY): Payer: Self-pay

## 2022-03-20 ENCOUNTER — Emergency Department (HOSPITAL_COMMUNITY)
Admission: EM | Admit: 2022-03-20 | Discharge: 2022-03-20 | Disposition: A | Discharge status: Home / Self Care | Payer: Medicaid Other | Attending: Emergency Medicine | Admitting: Emergency Medicine

## 2022-03-20 DIAGNOSIS — R1011 Right upper quadrant pain: Secondary | ICD-10-CM | POA: Insufficient documentation

## 2022-03-20 LAB — CBC
HCT: 41.6 % (ref 39.0–52.0)
Hemoglobin: 14.4 g/dL (ref 13.0–17.0)
MCH: 29.9 pg (ref 26.0–34.0)
MCHC: 34.6 g/dL (ref 30.0–36.0)
MCV: 86.3 fL (ref 80.0–100.0)
Platelets: 192 10*3/uL (ref 150–400)
RBC: 4.82 MIL/uL (ref 4.22–5.81)
RDW: 12.2 % (ref 11.5–15.5)
WBC: 6.8 10*3/uL (ref 4.0–10.5)
nRBC: 0 % (ref 0.0–0.2)

## 2022-03-20 LAB — COMPREHENSIVE METABOLIC PANEL
ALT: 22 U/L (ref 0–44)
AST: 21 U/L (ref 15–41)
Albumin: 4.8 g/dL (ref 3.5–5.0)
Alkaline Phosphatase: 71 U/L (ref 38–126)
Anion gap: 6 (ref 5–15)
BUN: 17 mg/dL (ref 6–20)
CO2: 28 mmol/L (ref 22–32)
Calcium: 9.2 mg/dL (ref 8.9–10.3)
Chloride: 105 mmol/L (ref 98–111)
Creatinine, Ser: 0.95 mg/dL (ref 0.61–1.24)
GFR, Estimated: >60 mL/min (ref >60)
Glucose, Bld: 95 mg/dL (ref 70–99)
Potassium: 4.5 mmol/L (ref 3.5–5.1)
Sodium: 139 mmol/L (ref 135–145)
Total Bilirubin: 1 mg/dL (ref 0.3–1.2)
Total Protein: 7.6 g/dL (ref 6.5–8.1)

## 2022-03-20 LAB — LIPASE, BLOOD: Lipase: 35 U/L (ref 11–51)

## 2022-03-20 MED ORDER — NAPROXEN 500 MG PO TABS: 500.0000 mg | ORAL_TABLET | Freq: Two times a day (BID) | ORAL | 0 refills | Status: DC

## 2022-03-20 NOTE — ED Provider Notes (Signed)
[facility] EMERGENCY DEPARTMENT Provider Note   CSN: [id number] Arrival date & time: 03/20/22  0108     History  Chief Complaint  Patient presents with   Abdominal Pain    RUQ pain x 3 days    A Rosie Place is a 21 y.o. male.  Patient is a 21 year old male with no significant past medical history.  He presents today with complaints of right upper quadrant pain.  This has been occurring intermittently for the past 5 days.  He denies any fevers or chills.  He denies any relation to food or drink.  He has been taking ibuprofen with some relief.  The history is provided by the patient.       Home Medications Prior to Admission medications   Medication Sig Start Date End Date Taking? Authorizing Provider  hydrOXYzine (ATARAX) 25 MG tablet Take 1 tablet (25 mg total) by mouth every 8 (eight) hours as needed for anxiety. 12/18/21   Kommor, Wyn Forster, MD      Allergies    Patient has no known allergies.    Review of Systems   Review of Systems  All other systems reviewed and are negative.   Physical Exam Updated Vital Signs BP 120/82 (BP Location: Right Arm)   Pulse 70   Temp 98.6 F (37 C) (Oral)   Resp 17   Ht 5\' 11"  (1.803 m)   Wt 66.2 kg   SpO2 98%   BMI 20.36 kg/m  Physical Exam Vitals and nursing note reviewed.  Constitutional:      General: He is not in acute distress.    Appearance: He is well-developed. He is not diaphoretic.  HENT:     Head: Normocephalic and atraumatic.  Cardiovascular:     Rate and Rhythm: Normal rate and regular rhythm.     Heart sounds: No murmur heard.    No friction rub.  Pulmonary:     Effort: Pulmonary effort is normal. No respiratory distress.     Breath sounds: Normal breath sounds. No wheezing or rales.  Abdominal:     General: Bowel sounds are normal. There is no distension.     Palpations: Abdomen is soft.     Tenderness: There is no abdominal tenderness.  Musculoskeletal:        General: Normal range of motion.      Cervical back: Normal range of motion and neck supple.  Skin:    General: Skin is warm and dry.  Neurological:     Mental Status: He is alert and oriented to person, place, and time.     Coordination: Coordination normal.     ED Results / Procedures / Treatments   Labs (all labs ordered are listed, but only abnormal results are displayed) Labs Reviewed  LIPASE, BLOOD  COMPREHENSIVE METABOLIC PANEL  CBC  URINALYSIS, ROUTINE W REFLEX MICROSCOPIC    EKG None  Radiology No results found.  Procedures Procedures    Medications Ordered in ED Medications - No data to display  ED Course/ Medical Decision Making/ A&P  Laboratory studies are unremarkable, vital signs are stable with no fever, and the abdomen is nontender upon my examination.  I see no indication for imaging studies.  Symptoms may be musculoskeletal in nature.  I will advise patient to take naproxen, rest, and follow-up as needed if not improving.  Final Clinical Impression(s) / ED Diagnoses Final diagnoses:  None    Rx / DC Orders ED Discharge Orders     None  Geoffery Lyons, MD 03/20/22 [phone]

## 2022-03-20 NOTE — ED Triage Notes (Signed)
Pt reports RUQ pain that started 3-4 days ago, pt reports nausea more so in the morning. Pain comes and goes, ibuprofen relieves pain but then comes back per pt

## 2022-03-20 NOTE — Discharge Instructions (Addendum)
Begin taking naproxen as prescribed.  Rest.  Follow-up with primary doctor if not improving in the next week, and return to the ER for worsening pain, high fever, bloody stool or vomit, or for other new and concerning symptoms.

## 2022-04-02 ENCOUNTER — Encounter (INDEPENDENT_AMBULATORY_CARE_PROVIDER_SITE_OTHER): Payer: Self-pay | Admitting: Gastroenterology

## 2022-04-02 ENCOUNTER — Ambulatory Visit (INDEPENDENT_AMBULATORY_CARE_PROVIDER_SITE_OTHER): Payer: Medicaid Other | Admitting: Gastroenterology

## 2022-04-02 VITALS — BP 125/87 | HR 66 | Temp 98.0°F | Ht 71.0 in | Wt 147.0 lb

## 2022-04-02 DIAGNOSIS — R1011 Right upper quadrant pain: Secondary | ICD-10-CM | POA: Diagnosis not present

## 2022-04-02 DIAGNOSIS — R110 Nausea: Secondary | ICD-10-CM | POA: Diagnosis not present

## 2022-04-02 NOTE — Patient Instructions (Addendum)
Please continue to avoid greasy, fried, high fat foods Try to write down what you ate, any activity prior to your pain the next time it occurs We will do an [place] of your gallbladder to rule this out as a cause, though as we discussed, sometimes these are normal and there is underlying dysfunction of your gallbladder. I will call you once I have the results of this study

## 2022-04-02 NOTE — H&P (View-Only) (Signed)
Referring Provider: No ref. provider found Primary Care Physician:  Patient, No Pcp Per Primary GI Physician: new  Chief Complaint  Patient presents with   Abdominal Pain    Hospital follow up on right side abdominal pain. Feeling better today but has pain off and on depending on what he eats. Some nausea in the mornings.    HPI:   Lee Ingram is a 21 y.o. male with past medical history of ADHD.  Patient presenting today as a new patient for hospital follow up.  Seen in ED on 03/20/22 with RUQ pain intermittently x5 days, no fevers, chills, precipitating factors. Taking ibuprofen with some relief.   Labs at that time with normal lipase, CMP WNL, normal WBC, Hgb and plt count.   Patient reports abdominal pain intermittently for the past few years, though with new RUQ pain since May.  Episode in may he went to the ED and there was concern for appendicitis as pain was in RLQ. Though CT was not completely consistent with it then he was given abx with improvement of RLQ pain thereafter. He reports that currently pain has been in RUQ. Occurring about 1-2x/week. Sometimes greasy foods bring on his symptoms. He has tried to cut out these which seems to help some. Sometimes he has nausea but does not vomit.  pain can radiate into mid chest but no radiation of pain into RUQ back or shoulders. Pain lasts sometimes all day. He takes ibuprofen sometimes which helps. Denies GERD symptoms, no fevers or chills. No constipation or diarrhea, no rectal bleeding or melena. Takes an occasional laxative. Is taking ibuprofen maybe a couple of times per week but no other NSAIDs.   NSAID use: ibuprofen 1-2/xweek  Social hx: no etoh, vapes Fam hx: no crc or liver disease   Last Colonoscopy: never Last Endoscopy: never  Recommendations:    Past Medical History:  Diagnosis Date   ADHD    Auditory processing disorder     No past surgical history on file.  Current Outpatient Medications  Medication  Sig Dispense Refill   Bisacodyl (LAXATIVE PO) Take by mouth. Takes as needed     hydrOXYzine (ATARAX) 25 MG tablet Take 1 tablet (25 mg total) by mouth every 8 (eight) hours as needed for anxiety. 30 tablet 0   ibuprofen (ADVIL) 200 MG tablet Take 200 mg by mouth. Takes as needed     naproxen (NAPROSYN) 500 MG tablet Take 1 tablet (500 mg total) by mouth 2 (two) times daily. (Patient not taking: Reported on 04/02/2022) 20 tablet 0   No current facility-administered medications for this visit.    Allergies as of 04/02/2022   (No Known Allergies)    No family history on file.  Social History   Socioeconomic History   Marital status: Single    Spouse name: Not on file   Number of children: Not on file   Years of education: Not on file   Highest education level: Not on file  Occupational History   Not on file  Tobacco Use   Smoking status: Every Day    Types: E-cigarettes    Passive exposure: Current   Smokeless tobacco: Never  Vaping Use   Vaping Use: Former  Substance and Sexual Activity   Alcohol use: Never   Drug use: Never   Sexual activity: Not on file  Other Topics Concern   Not on file  Social History Narrative   Not on file   Social Determinants of Health  Financial Resource Strain: Not on file  Food Insecurity: Not on file  Transportation Needs: Not on file  Physical Activity: Not on file  Stress: Not on file  Social Connections: Not on file   Review of systems General: negative for malaise, night sweats, fever, chills, weight loss Neck: Negative for lumps, goiter, pain and significant neck swelling Resp: Negative for cough, wheezing, dyspnea at rest CV: Negative for chest pain, leg swelling, palpitations, orthopnea GI: denies melena, hematochezia,vomiting, diarrhea, constipation, dysphagia, odyonophagia, early satiety or unintentional weight loss. +RUQ pain +nausea MSK: Negative for joint pain or swelling, back pain, and muscle pain. Derm: Negative for  itching or rash Psych: Denies depression, anxiety, memory loss, confusion. No homicidal or suicidal ideation.  Heme: Negative for prolonged bleeding, bruising easily, and swollen nodes. Endocrine: Negative for cold or heat intolerance, polyuria, polydipsia and goiter. Neuro: negative for tremor, gait imbalance, syncope and seizures. The remainder of the review of systems is noncontributory.  Physical Exam: BP 125/87 (BP Location: Left Arm, Patient Position: Sitting, Cuff Size: Normal)   Pulse 66   Temp 98 F (36.7 C) (Oral)   Ht 5' 11" (1.803 m)   Wt 147 lb (66.7 kg)   BMI 20.50 kg/m  General:   Alert and oriented. No distress noted. Pleasant and cooperative.  Head:  Normocephalic and atraumatic. Eyes:  Conjuctiva clear without scleral icterus. Mouth:  Oral mucosa pink and moist. Good dentition. No lesions. Heart: Normal rate and rhythm, s1 and s2 heart sounds present.  Lungs: Clear lung sounds in all lobes. Respirations equal and unlabored. Abdomen:  +BS, soft, non-tender (neg murphy's sign) and non-distended. No rebound or guarding. No HSM or masses noted. Derm: No palmar erythema or jaundice Msk:  Symmetrical without gross deformities. Normal posture. Extremities:  Without edema. Neurologic:  Alert and  oriented x4 Psych:  Alert and cooperative. Normal mood and affect.  Invalid input(s): "6 MONTHS"   ASSESSMENT: Lee Ingram is a 20 y.o. male presenting today as a new patient for RUQ pain and nausea.  RUQ pain and nausea, intermittently since May, recent ED visit for this with labs all WNL. No imaging done. He denies any fevers or chills. Pain has improved some with decrease in greasy/fatty foods. Abdominal exam is unremarkable. Discussed potential etiology of his pain to include cholecystitis, dysfunction of the gallbladder or potential musculoskeletal etiology, though given symptoms are ongoing with some nausea we will proceed with RUQ US to evaluate for GB etiology. I did  discuss with the patient that often times GB US may be normal, this in itself does not completely rule out the GB as a source of his symptoms, reassuringly bilirubin and LFTs were all WNL earlier this month. He wishes to proceed with US at this time. Advised him to continue with low fat diet. He will make me aware if symptoms worsen or he develops any new associated symptoms.    PLAN:  RUQ US  2. Avoid greasy, fried, high fat foods 3. Pt to make me aware of worsening pain, or new associated symptoms   All questions were answered, patient verbalized understanding and is in agreement with plan as outlined above.    Follow Up: TBD  Braedin Millhouse L. Toan Mort, MSN, APRN, AGNP-C Adult-Gerontology Nurse Practitioner [facility]  

## 2022-04-02 NOTE — Progress Notes (Signed)
Referring Provider: No ref. provider found Primary Care Physician:  Patient, No Pcp Per Primary GI Physician: new  Chief Complaint  Patient presents with   Abdominal Pain    Hospital follow up on right side abdominal pain. Feeling better today but has pain off and on depending on what he eats. Some nausea in the mornings.    HPI:   Lee Ingram is a 21 y.o. male with past medical history of ADHD.  Patient presenting today as a new patient for hospital follow up.  Seen in ED on 03/20/22 with RUQ pain intermittently x5 days, no fevers, chills, precipitating factors. Taking ibuprofen with some relief.   Labs at that time with normal lipase, CMP WNL, normal WBC, Hgb and plt count.   Patient reports abdominal pain intermittently for the past few years, though with new RUQ pain since May.  Episode in may he went to the ED and there was concern for appendicitis as pain was in RLQ. Though CT was not completely consistent with it then he was given abx with improvement of RLQ pain thereafter. He reports that currently pain has been in RUQ. Occurring about 1-2x/week. Sometimes greasy foods bring on his symptoms. He has tried to cut out these which seems to help some. Sometimes he has nausea but does not vomit.  pain can radiate into mid chest but no radiation of pain into RUQ back or shoulders. Pain lasts sometimes all day. He takes ibuprofen sometimes which helps. Denies GERD symptoms, no fevers or chills. No constipation or diarrhea, no rectal bleeding or melena. Takes an occasional laxative. Is taking ibuprofen maybe a couple of times per week but no other NSAIDs.   NSAID use: ibuprofen 1-2/xweek  Social hx: no etoh, vapes Fam hx: no crc or liver disease   Last Colonoscopy: never Last Endoscopy: never  Recommendations:    Past Medical History:  Diagnosis Date   ADHD    Auditory processing disorder     No past surgical history on file.  Current Outpatient Medications  Medication  Sig Dispense Refill   Bisacodyl (LAXATIVE PO) Take by mouth. Takes as needed     hydrOXYzine (ATARAX) 25 MG tablet Take 1 tablet (25 mg total) by mouth every 8 (eight) hours as needed for anxiety. 30 tablet 0   ibuprofen (ADVIL) 200 MG tablet Take 200 mg by mouth. Takes as needed     naproxen (NAPROSYN) 500 MG tablet Take 1 tablet (500 mg total) by mouth 2 (two) times daily. (Patient not taking: Reported on 04/02/2022) 20 tablet 0   No current facility-administered medications for this visit.    Allergies as of 04/02/2022   (No Known Allergies)    No family history on file.  Social History   Socioeconomic History   Marital status: Single    Spouse name: Not on file   Number of children: Not on file   Years of education: Not on file   Highest education level: Not on file  Occupational History   Not on file  Tobacco Use   Smoking status: Every Day    Types: E-cigarettes    Passive exposure: Current   Smokeless tobacco: Never  Vaping Use   Vaping Use: Former  Substance and Sexual Activity   Alcohol use: Never   Drug use: Never   Sexual activity: Not on file  Other Topics Concern   Not on file  Social History Narrative   Not on file   Social Determinants of Health  Financial Resource Strain: Not on file  Food Insecurity: Not on file  Transportation Needs: Not on file  Physical Activity: Not on file  Stress: Not on file  Social Connections: Not on file   Review of systems General: negative for malaise, night sweats, fever, chills, weight loss Neck: Negative for lumps, goiter, pain and significant neck swelling Resp: Negative for cough, wheezing, dyspnea at rest CV: Negative for chest pain, leg swelling, palpitations, orthopnea GI: denies melena, hematochezia,vomiting, diarrhea, constipation, dysphagia, odyonophagia, early satiety or unintentional weight loss. +RUQ pain +nausea MSK: Negative for joint pain or swelling, back pain, and muscle pain. Derm: Negative for  itching or rash Psych: Denies depression, anxiety, memory loss, confusion. No homicidal or suicidal ideation.  Heme: Negative for prolonged bleeding, bruising easily, and swollen nodes. Endocrine: Negative for cold or heat intolerance, polyuria, polydipsia and goiter. Neuro: negative for tremor, gait imbalance, syncope and seizures. The remainder of the review of systems is noncontributory.  Physical Exam: BP 125/87 (BP Location: Left Arm, Patient Position: Sitting, Cuff Size: Normal)   Pulse 66   Temp 98 F (36.7 C) (Oral)   Ht 5\' 11"  (1.803 m)   Wt 147 lb (66.7 kg)   BMI 20.50 kg/m  General:   Alert and oriented. No distress noted. Pleasant and cooperative.  Head:  Normocephalic and atraumatic. Eyes:  Conjuctiva clear without scleral icterus. Mouth:  Oral mucosa pink and moist. Good dentition. No lesions. Heart: Normal rate and rhythm, s1 and s2 heart sounds present.  Lungs: Clear lung sounds in all lobes. Respirations equal and unlabored. Abdomen:  +BS, soft, non-tender (neg murphy's sign) and non-distended. No rebound or guarding. No HSM or masses noted. Derm: No palmar erythema or jaundice Msk:  Symmetrical without gross deformities. Normal posture. Extremities:  Without edema. Neurologic:  Alert and  oriented x4 Psych:  Alert and cooperative. Normal mood and affect.  Invalid input(s): "6 MONTHS"   ASSESSMENT: Lee Ingram is a 21 y.o. male presenting today as a new patient for RUQ pain and nausea.  RUQ pain and nausea, intermittently since May, recent ED visit for this with labs all WNL. No imaging done. He denies any fevers or chills. Pain has improved some with decrease in greasy/fatty foods. Abdominal exam is unremarkable. Discussed potential etiology of his pain to include cholecystitis, dysfunction of the gallbladder or potential musculoskeletal etiology, though given symptoms are ongoing with some nausea we will proceed with RUQ June to evaluate for GB etiology. I did  discuss with the patient that often times GB US may be normal, this in itself does not completely rule out the GB as a source of his symptoms, reassuringly bilirubin and LFTs were all WNL earlier this month. He wishes to proceed with [place] at this time. Advised him to continue with low fat diet. He will make me aware if symptoms worsen or he develops any new associated symptoms.    PLAN:  RUQ [place]  2. Avoid greasy, fried, high fat foods 3. Pt to make me aware of worsening pain, or new associated symptoms   All questions were answered, patient verbalized understanding and is in agreement with plan as outlined above.    Follow Up: TBD  Jaleeyah Munce L. [place], MSN, APRN, AGNP-C Adult-Gerontology Nurse Practitioner [facility]

## 2022-04-10 ENCOUNTER — Ambulatory Visit (HOSPITAL_COMMUNITY)
Admission: RE | Admit: 2022-04-10 | Discharge: 2022-04-10 | Disposition: A | Discharge status: Home / Self Care | Payer: Medicaid Other | Source: Ambulatory Visit | Attending: Gastroenterology | Admitting: Gastroenterology

## 2022-04-10 DIAGNOSIS — R1011 Right upper quadrant pain: Secondary | ICD-10-CM | POA: Diagnosis present

## 2022-04-10 DIAGNOSIS — R110 Nausea: Secondary | ICD-10-CM | POA: Insufficient documentation

## 2022-04-22 ENCOUNTER — Telehealth (INDEPENDENT_AMBULATORY_CARE_PROVIDER_SITE_OTHER): Payer: Self-pay

## 2022-04-22 ENCOUNTER — Encounter (INDEPENDENT_AMBULATORY_CARE_PROVIDER_SITE_OTHER): Payer: Self-pay

## 2022-04-22 NOTE — Telephone Encounter (Signed)
Opened in error

## 2022-04-23 ENCOUNTER — Other Ambulatory Visit (INDEPENDENT_AMBULATORY_CARE_PROVIDER_SITE_OTHER): Payer: Self-pay

## 2022-04-23 ENCOUNTER — Telehealth (INDEPENDENT_AMBULATORY_CARE_PROVIDER_SITE_OTHER): Payer: Self-pay | Admitting: *Deleted

## 2022-04-23 DIAGNOSIS — R110 Nausea: Secondary | ICD-10-CM

## 2022-04-23 DIAGNOSIS — R1011 Right upper quadrant pain: Secondary | ICD-10-CM

## 2022-04-23 NOTE — Telephone Encounter (Signed)
Pt called and and spoke with Benedetto Goad about scheduling a procedure

## 2022-04-23 NOTE — Telephone Encounter (Signed)
Patient left voicemail asking for a call. [phone]. Tried to call patient back and no answer

## 2022-04-24 ENCOUNTER — Encounter (HOSPITAL_COMMUNITY): Payer: Self-pay

## 2022-04-24 ENCOUNTER — Ambulatory Visit (HOSPITAL_COMMUNITY): Payer: Medicaid Other | Admitting: Registered Nurse

## 2022-04-24 ENCOUNTER — Ambulatory Visit (HOSPITAL_BASED_OUTPATIENT_CLINIC_OR_DEPARTMENT_OTHER): Payer: Medicaid Other | Admitting: Registered Nurse

## 2022-04-24 ENCOUNTER — Encounter (HOSPITAL_COMMUNITY): Payer: Self-pay | Admitting: Gastroenterology

## 2022-04-24 ENCOUNTER — Other Ambulatory Visit: Payer: Self-pay

## 2022-04-24 ENCOUNTER — Ambulatory Visit (HOSPITAL_COMMUNITY)
Admission: RE | Admit: 2022-04-24 | Discharge: 2022-04-24 | Disposition: A | Discharge status: Home / Self Care | Payer: Medicaid Other | Attending: Gastroenterology | Admitting: Gastroenterology

## 2022-04-24 ENCOUNTER — Encounter (HOSPITAL_COMMUNITY)
Admission: RE | Disposition: A | Discharge status: Home / Self Care | Payer: Self-pay | Source: Home / Self Care | Attending: Gastroenterology

## 2022-04-24 DIAGNOSIS — R112 Nausea with vomiting, unspecified: Secondary | ICD-10-CM | POA: Insufficient documentation

## 2022-04-24 DIAGNOSIS — K2950 Unspecified chronic gastritis without bleeding: Secondary | ICD-10-CM | POA: Insufficient documentation

## 2022-04-24 DIAGNOSIS — R1011 Right upper quadrant pain: Secondary | ICD-10-CM | POA: Insufficient documentation

## 2022-04-24 DIAGNOSIS — F17290 Nicotine dependence, other tobacco product, uncomplicated: Secondary | ICD-10-CM | POA: Diagnosis not present

## 2022-04-24 DIAGNOSIS — R110 Nausea: Secondary | ICD-10-CM

## 2022-04-24 HISTORY — PX: BIOPSY: SHX5522

## 2022-04-24 HISTORY — PX: ESOPHAGOGASTRODUODENOSCOPY (EGD) WITH PROPOFOL: SHX5813

## 2022-04-24 SURGERY — ESOPHAGOGASTRODUODENOSCOPY (EGD) WITH PROPOFOL
Anesthesia: General

## 2022-04-24 SURGERY — ESOPHAGOGASTRODUODENOSCOPY (EGD) WITH PROPOFOL
Anesthesia: Monitor Anesthesia Care

## 2022-04-24 MED ORDER — LACTATED RINGERS IV SOLN
INTRAVENOUS | Status: DC
Start: 2022-04-24 — End: 2022-04-24
  Administered 2022-04-24: 1000 mL via INTRAVENOUS

## 2022-04-24 MED ORDER — OMEPRAZOLE 40 MG PO CPDR: 40.0000 mg | DELAYED_RELEASE_CAPSULE | Freq: Every day | ORAL | 3 refills | Status: AC

## 2022-04-24 MED ORDER — PROPOFOL 10 MG/ML IV BOLUS
INTRAVENOUS | Status: DC | PRN
  Administered 2022-04-24: 50 mg via INTRAVENOUS
  Administered 2022-04-24 (×2): 100 mg via INTRAVENOUS
  Administered 2022-04-24: 50 mg via INTRAVENOUS
  Administered 2022-04-24: 100 mg via INTRAVENOUS

## 2022-04-24 NOTE — Transfer of Care (Signed)
Immediate Anesthesia Transfer of Care Note  Patient: [facility]  Procedure(s) Performed: ESOPHAGOGASTRODUODENOSCOPY (EGD) WITH PROPOFOL BIOPSY  Patient Location: PACU  Anesthesia Type:MAC  Level of Consciousness: awake, alert  and oriented  Airway & Oxygen Therapy: Patient Spontanous Breathing  Post-op Assessment: Report given to RN  Post vital signs: Reviewed and stable  Last Vitals:  Vitals Value Taken Time  BP 101/58 04/24/22 1319  Temp 37.8 C 04/24/22 1319  Pulse 60 04/24/22 1319  Resp 16 04/24/22 1319  SpO2 99 % 04/24/22 1319    Last Pain:  Vitals:   04/24/22 1319  TempSrc: Oral  PainSc:       Patients Stated Pain Goal: 6 (40/34/74 2595)  Complications: No notable events documented.

## 2022-04-24 NOTE — Anesthesia Postprocedure Evaluation (Signed)
Anesthesia Post Note  Patient: [name]  Procedure(s) Performed: ESOPHAGOGASTRODUODENOSCOPY (EGD) WITH PROPOFOL BIOPSY  Patient location during evaluation: Phase II Anesthesia Type: General Level of consciousness: awake and alert and oriented Pain management: pain level controlled Vital Signs Assessment: post-procedure vital signs reviewed and stable Respiratory status: spontaneous breathing, nonlabored ventilation and respiratory function stable Cardiovascular status: blood pressure returned to baseline and stable Postop Assessment: no apparent nausea or vomiting Anesthetic complications: no   No notable events documented.   Last Vitals:  Vitals:   04/24/22 1319 04/24/22 1322  BP: (!) 101/58 (!) 92/45  Pulse: 60 83  Resp: 16 17  Temp: 37.8 C   SpO2: 99% 96%    Last Pain:  Vitals:   04/24/22 1322  TempSrc:   PainSc: 0-No pain                 Lee Ingram C Chaze Hruska

## 2022-04-24 NOTE — Op Note (Signed)
[facility] Patient Name: [facility] Procedure Date: 04/24/2022 12:39 PM MRN: [id number] Date of Birth: [birth year] Attending MD: Katrinka Blazing ,  CSN: [id number] Age: 21 Admit Type: Outpatient Procedure:                Upper GI endoscopy Indications:              Abdominal pain in the right upper quadrant, Nausea                            with vomiting Providers:                Katrinka Blazing, Jannett Celestine, RN, Edrick Kins, RN Referring MD:              Medicines:                Monitored Anesthesia Care Complications:            No immediate complications. Estimated Blood Loss:     Estimated blood loss: none. Procedure:                Pre-Anesthesia Assessment:                           - Prior to the procedure, a History and Physical                            was performed, and patient medications, allergies                            and sensitivities were reviewed. The patient's                            tolerance of previous anesthesia was reviewed.                           - The risks and benefits of the procedure and the                            sedation options and risks were discussed with the                            patient. All questions were answered and informed                            consent was obtained.                           - ASA Grade Assessment: I - A normal, healthy                            patient.                           After obtaining informed consent, the endoscope was                            passed under direct vision. Throughout  the                            procedure, the patient's blood pressure, pulse, and                            oxygen saturations were monitored continuously. The                            [id number]-H190 ([id number]) scope was introduced through the                            mouth, and advanced to the second part of duodenum.                            The upper GI endoscopy was accomplished without                             difficulty. The patient tolerated the procedure                            well. Scope In: 1:03:53 PM Scope Out: 1:11:05 PM Total Procedure Duration: 0 hours 7 minutes 12 seconds  Findings:      The examined esophagus was normal.      The gastroesophageal flap valve was visualized endoscopically and       classified as Hill Grade II (fold present, opens with respiration).      The entire examined stomach was normal. Biopsies were taken with a cold       forceps for Helicobacter pylori testing.      The examined duodenum was normal. Biopsies were taken with a cold       forceps for histology. Impression:               - Normal esophagus.                           - Gastroesophageal flap valve classified as Hill                            Grade II (fold present, opens with respiration).                           - Normal stomach. Biopsied.                           - Normal examined duodenum. Biopsied. Moderate Sedation:      Per Anesthesia Care Recommendation:           - Discharge patient to home (ambulatory).                           - Resume previous diet.                           - Await pathology results.                           -  Use Prilosec (omeprazole) 40 mg PO daily. Procedure Code(s):        --- Professional ---                           [phone], Esophagogastroduodenoscopy, flexible,                            transoral; with biopsy, single or multiple Diagnosis Code(s):        --- Professional ---                           R10.11, Right upper quadrant pain                           R11.2, Nausea with vomiting, unspecified CPT copyright 2019 American Medical Association. All rights reserved. The codes documented in this report are preliminary and upon coder review may  be revised to meet current compliance requirements. Katrinka Blazing, MD Katrinka Blazing,  04/24/2022 1:14:56 PM This report has been signed electronically. Number of  Addenda: 0

## 2022-04-24 NOTE — Progress Notes (Signed)
Please excuse [facility] from work today April 21, 2022 until Saturday April 25, 2022 after 1:00pm. 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

## 2022-04-24 NOTE — Interval H&P Note (Signed)
History and Physical Interval Note:  04/24/2022 12:42 PM  [facility]  has presented today for surgery, with the diagnosis of RUQ Pain Nausea.  The various methods of treatment have been discussed with the patient and family. After consideration of risks, benefits and other options for treatment, the patient has consented to  Procedure(s) with comments: ESOPHAGOGASTRODUODENOSCOPY (EGD) WITH PROPOFOL (N/A) - 145 ASA 1 as a surgical intervention.  The patient's history has been reviewed, patient examined, no change in status, stable for surgery.  I have reviewed the patient's chart and labs.  Questions were answered to the patient's satisfaction.     Katrinka Blazing Mayorga

## 2022-04-24 NOTE — Anesthesia Preprocedure Evaluation (Signed)
Anesthesia Evaluation  Patient identified by MRN, date of birth, ID band Patient awake    Reviewed: Allergy & Precautions, NPO status , Patient's Chart, lab work & pertinent test results  Airway Mallampati: I  TM Distance: >3 FB Neck ROM: Full    Dental  (+) Teeth Intact, Dental Advisory Given   Pulmonary Current Smoker,    Pulmonary exam normal breath sounds clear to auscultation       Cardiovascular negative cardio ROS Normal cardiovascular exam Rhythm:Regular Rate:Normal     Neuro/Psych PSYCHIATRIC DISORDERS negative neurological ROS     GI/Hepatic negative GI ROS, Neg liver ROS,   Endo/Other  negative endocrine ROS  Renal/GU negative Renal ROS  negative genitourinary   Musculoskeletal negative musculoskeletal ROS (+)   Abdominal   Peds  (+) ADHD Hematology negative hematology ROS (+)   Anesthesia Other Findings   Reproductive/Obstetrics negative OB ROS                             Anesthesia Physical Anesthesia Plan  ASA: 2  Anesthesia Plan: General   Post-op Pain Management: Minimal or no pain anticipated   Induction: Intravenous  PONV Risk Score and Plan: Propofol infusion  Airway Management Planned: Nasal Cannula and Natural Airway  Additional Equipment:   Intra-op Plan:   Post-operative Plan:   Informed Consent: I have reviewed the patients History and Physical, chart, labs and discussed the procedure including the risks, benefits and alternatives for the proposed anesthesia with the patient or authorized representative who has indicated his/her understanding and acceptance.     Dental advisory given  Plan Discussed with: CRNA and Surgeon  Anesthesia Plan Comments:         Anesthesia Quick Evaluation

## 2022-04-24 NOTE — Discharge Instructions (Signed)
You are being discharged to home.  Resume your previous diet.  We are waiting for your pathology results.  Take Prilosec (omeprazole) 40 mg by mouth once a day.  

## 2022-04-27 LAB — SURGICAL PATHOLOGY

## 2022-04-29 ENCOUNTER — Encounter (HOSPITAL_COMMUNITY): Payer: Self-pay | Admitting: Gastroenterology

## 2022-05-19 ENCOUNTER — Other Ambulatory Visit (HOSPITAL_COMMUNITY): Payer: Medicaid Other

## 2022-05-21 ENCOUNTER — Ambulatory Visit (HOSPITAL_COMMUNITY): Admit: 2022-05-21 | Payer: Medicaid Other | Admitting: Gastroenterology

## 2022-06-25 ENCOUNTER — Telehealth (HOSPITAL_COMMUNITY): Payer: Self-pay | Admitting: Emergency Medicine

## 2022-06-25 ENCOUNTER — Encounter (HOSPITAL_COMMUNITY): Payer: Self-pay | Admitting: Emergency Medicine

## 2022-06-25 ENCOUNTER — Emergency Department (HOSPITAL_COMMUNITY): Payer: Medicaid Other

## 2022-06-25 ENCOUNTER — Emergency Department (HOSPITAL_COMMUNITY)
Admission: EM | Admit: 2022-06-25 | Discharge: 2022-06-25 | Disposition: A | Discharge status: Home / Self Care | Payer: Medicaid Other | Attending: Emergency Medicine | Admitting: Emergency Medicine

## 2022-06-25 DIAGNOSIS — R072 Precordial pain: Secondary | ICD-10-CM | POA: Diagnosis present

## 2022-06-25 DIAGNOSIS — R079 Chest pain, unspecified: Secondary | ICD-10-CM

## 2022-06-25 LAB — BASIC METABOLIC PANEL
Anion gap: 9 (ref 5–15)
BUN: 19 mg/dL (ref 6–20)
CO2: 26 mmol/L (ref 22–32)
Calcium: 9.4 mg/dL (ref 8.9–10.3)
Chloride: 105 mmol/L (ref 98–111)
Creatinine, Ser: 0.9 mg/dL (ref 0.61–1.24)
GFR, Estimated: >60 mL/min (ref >60)
Glucose, Bld: 120 mg/dL — ABNORMAL HIGH (ref 70–99)
Potassium: 3.9 mmol/L (ref 3.5–5.1)
Sodium: 140 mmol/L (ref 135–145)

## 2022-06-25 LAB — TROPONIN I (HIGH SENSITIVITY)
Troponin I (High Sensitivity): 2 ng/L (ref <18)
Troponin I (High Sensitivity): <2 ng/L (ref <18)

## 2022-06-25 LAB — CBC
HCT: 45.4 % (ref 39.0–52.0)
Hemoglobin: 15.6 g/dL (ref 13.0–17.0)
MCH: 30 pg (ref 26.0–34.0)
MCHC: 34.4 g/dL (ref 30.0–36.0)
MCV: 87.3 fL (ref 80.0–100.0)
Platelets: 209 10*3/uL (ref 150–400)
RBC: 5.2 MIL/uL (ref 4.22–5.81)
RDW: 11.8 % (ref 11.5–15.5)
WBC: 10.5 10*3/uL (ref 4.0–10.5)
nRBC: 0 % (ref 0.0–0.2)

## 2022-06-25 LAB — D-DIMER, QUANTITATIVE: D-Dimer, Quant: <0.27 ug/mL-FEU (ref 0.00–0.50)

## 2022-06-25 MED ORDER — PANTOPRAZOLE SODIUM 20 MG PO TBEC: 20.0000 mg | DELAYED_RELEASE_TABLET | Freq: Every day | ORAL | 0 refills | Status: DC

## 2022-06-25 MED ORDER — IBUPROFEN 800 MG PO TABS
800.0000 mg | ORAL_TABLET | Freq: Once | ORAL | Status: AC
  Administered 2022-06-25: 800 mg via ORAL
  Filled 2022-06-25: qty 1

## 2022-06-25 MED ORDER — IBUPROFEN 400 MG PO TABS: 400.0000 mg | ORAL_TABLET | Freq: Four times a day (QID) | ORAL | 0 refills | Status: DC | PRN

## 2022-06-25 MED ORDER — PANTOPRAZOLE SODIUM 40 MG PO TBEC: 40.0000 mg | DELAYED_RELEASE_TABLET | Freq: Every day | ORAL | Status: DC

## 2022-06-25 MED ORDER — PANTOPRAZOLE SODIUM 40 MG PO TBEC
40.0000 mg | DELAYED_RELEASE_TABLET | Freq: Every day | ORAL | Status: DC
  Administered 2022-06-25: 40 mg via ORAL
  Filled 2022-06-25: qty 1

## 2022-06-25 MED ORDER — PANTOPRAZOLE SODIUM 40 MG IV SOLR: 40.0000 mg | Freq: Once | INTRAVENOUS | Status: DC

## 2022-06-25 MED ORDER — PANTOPRAZOLE SODIUM 20 MG PO TBEC: 20.0000 mg | DELAYED_RELEASE_TABLET | Freq: Every day | ORAL | 0 refills | Status: AC

## 2022-06-25 MED ORDER — IBUPROFEN 400 MG PO TABS: 400.0000 mg | ORAL_TABLET | Freq: Four times a day (QID) | ORAL | 0 refills | Status: AC | PRN

## 2022-06-25 NOTE — Telephone Encounter (Signed)
Medications reportedly had not been printed out.  Sent in previous prescriptions.  Does not need Protonix if already on Prilosec.

## 2022-06-25 NOTE — ED Triage Notes (Signed)
Pt c/o off and on chest pain for the past 4 days. Pt states he only came in to be seen tonight since it had been 4 days. Pt denies any pain at this time.

## 2022-06-25 NOTE — ED Provider Notes (Signed)
[facility] EMERGENCY DEPARTMENT Provider Note   CSN: [id number] Arrival date & time: 06/25/22  0039     History  Chief Complaint  Patient presents with   Chest Pain    Lee Ingram is a 21 y.o. male.  4 days of intermittent episodes of sharp retrosternal chest pain that happened 3-4 times at a time but then goes away.  Everything at this before.  Some nausea but no vomiting.  No trauma.  No cough or fevers.  No rash.  No alcohol drugs or tobacco.   Chest Pain      Home Medications Prior to Admission medications   Medication Sig Start Date End Date Taking? Authorizing Provider  ibuprofen (ADVIL) 400 MG tablet Take 1 tablet (400 mg total) by mouth every 6 (six) hours as needed. 06/25/22  Yes Ajane Novella, Corene Cornea, MD  pantoprazole (PROTONIX) 20 MG tablet Take 1 tablet (20 mg total) by mouth daily for 14 days. 06/25/22 07/09/22 Yes Johan Creveling, Corene Cornea, MD  Bisacodyl (LAXATIVE PO) Take by mouth. Takes as needed    [provider]  hydrOXYzine (ATARAX) 25 MG tablet Take 1 tablet (25 mg total) by mouth every 8 (eight) hours as needed for anxiety. 12/18/21   Kommor, [place], MD  omeprazole (PRILOSEC) 40 MG capsule Take 1 capsule (40 mg total) by mouth daily. 04/24/22   Harvel Quale, MD      Allergies    Patient has no known allergies.    Review of Systems   Review of Systems  Cardiovascular:  Positive for chest pain.    Physical Exam Updated Vital Signs BP 132/75 (BP Location: Right Arm)   Pulse (!) 112   Temp 98.7 F (37.1 C) (Oral)   Resp 20   Ht 5\' 11"  (1.803 m)   Wt 65.8 kg   SpO2 100%   BMI 20.22 kg/m  Physical Exam Vitals and nursing note reviewed.  Constitutional:      Appearance: He is well-developed.  HENT:     Head: Normocephalic and atraumatic.  Cardiovascular:     Rate and Rhythm: Normal rate.  Pulmonary:     Effort: Pulmonary effort is normal. No respiratory distress.  Abdominal:     General: There is no distension.   Musculoskeletal:        General: Normal range of motion.     Cervical back: Normal range of motion.     Right lower leg: No edema.     Left lower leg: No edema.  Skin:    General: Skin is warm and dry.  Neurological:     Mental Status: He is alert.     ED Results / Procedures / Treatments   Labs (all labs ordered are listed, but only abnormal results are displayed) Labs Reviewed  BASIC METABOLIC PANEL - Abnormal; Notable for the following components:      Result Value   Glucose, Bld 120 (*)    All other components within normal limits  CBC  D-DIMER, QUANTITATIVE  TROPONIN I (HIGH SENSITIVITY)  TROPONIN I (HIGH SENSITIVITY)    EKG EKG Interpretation  Date/Time:  Thursday June 25 2022 01:04:47 EDT Ventricular Rate:  117 PR Interval:  176 QRS Duration: 90 QT Interval:  314 QTC Calculation: 438 R Axis:   126 Text Interpretation: Sinus tachycardia Right atrial enlargement Right axis deviation Pulmonary disease pattern Abnormal ECG When compared with ECG of 15-Jan-2022 23:48, PREVIOUS ECG IS PRESENT similar to previous with exception of faster rate Confirmed by Merrily Pew [phone])  on 06/25/2022 1:34:37 AM  Radiology DG Chest 2 View  Result Date: 06/25/2022 CLINICAL DATA:  Chest pain EXAM: CHEST - 2 VIEW COMPARISON:  01/16/2022 FINDINGS: The heart size and mediastinal contours are within normal limits. Both lungs are clear. The visualized skeletal structures are unremarkable. IMPRESSION: No active cardiopulmonary disease. Electronically Signed   By: Deatra Robinson M.D.   On: 06/25/2022 01:19    Procedures Procedures    Medications Ordered in ED Medications  pantoprazole (PROTONIX) EC tablet 40 mg (40 mg Oral Given 06/25/22 0407)  ibuprofen (ADVIL) tablet 800 mg (800 mg Oral Given 06/25/22 0407)    ED Course/ Medical Decision Making/ A&P                           Medical Decision Making Amount and/or Complexity of Data Reviewed Labs: ordered. Radiology:  ordered.  Risk Prescription drug management.   Unclear etiology of his discomfort.  His work-up here is negative.  D-dimer was done for consideration of PE but this was unremarkable.  His pain has not been present since he has been back here.  It is very transient nature and wonders if like esophageal spasm or costochondritis or indigestion/gas could be causing his symptoms.  Doubt cardiac causes of the negative troponins and relatively normal EKG.  No evidence of pancreatitis, cholecystitis, hepatitis or other etiologies.  I considered admit him to the hospital for further work-up however he is asymptomatic at this time is young without any other risk factors I feel that this is safe to be discharged and follow-up with his primary care doctor. Will trial pronix/NSAID combo for a week to see if it helps any of the more benign possibilities.   Final Clinical Impression(s) / ED Diagnoses Final diagnoses:  Nonspecific chest pain    Rx / DC Orders ED Discharge Orders          Ordered    ibuprofen (ADVIL) 400 MG tablet  Every 6 hours PRN        06/25/22 0352    pantoprazole (PROTONIX) 20 MG tablet  Daily        06/25/22 0352              Feven Alderfer, Barbara Cower, MD 06/25/22 [phone]

## 2022-11-06 ENCOUNTER — Ambulatory Visit: Payer: Medicaid Other | Attending: Internal Medicine | Admitting: Internal Medicine

## 2022-12-28 ENCOUNTER — Encounter: Payer: Self-pay | Admitting: Internal Medicine

## 2022-12-28 ENCOUNTER — Ambulatory Visit: Payer: Medicaid Other | Attending: Internal Medicine | Admitting: Internal Medicine

## 2022-12-28 VITALS — BP 98/72 | HR 103 | Ht 71.0 in | Wt 153.8 lb

## 2022-12-28 DIAGNOSIS — R002 Palpitations: Secondary | ICD-10-CM | POA: Diagnosis not present

## 2022-12-28 DIAGNOSIS — R42 Dizziness and giddiness: Secondary | ICD-10-CM | POA: Diagnosis not present

## 2022-12-28 DIAGNOSIS — R9431 Abnormal electrocardiogram [ECG] [EKG]: Secondary | ICD-10-CM

## 2022-12-28 DIAGNOSIS — R55 Syncope and collapse: Secondary | ICD-10-CM | POA: Diagnosis not present

## 2022-12-28 NOTE — Patient Instructions (Signed)
Medication Instructions:  No Changes In Medications at this time.  *If you need a refill on your cardiac medications before your next appointment, please call your pharmacy*  Lab Work: None Ordered At This Time.  If you have labs (blood work) drawn today and your tests are completely normal, you will receive your results only by: MyChart Message (if you have MyChart) OR A paper copy in the mail If you have any lab test that is abnormal or we need to change your treatment, we will call you to review the results.  Testing/Procedures: Your physician has requested that you have an echocardiogram. Echocardiography is a painless test that uses sound waves to create images of your heart. It provides your doctor with information about the size and shape of your heart and how well your heart's chambers and valves are working. You may receive an ultrasound enhancing agent through an IV if needed to better visualize your heart during the echo.This procedure takes approximately one hour. There are no restrictions for this procedure. This will take place at the [address], [address].   Follow-Up: At [facility], you and your health needs are our priority.  As part of our continuing mission to provide you with exceptional heart care, we have created designated Provider Care Teams.  These Care Teams include your primary Cardiologist (physician) and Advanced Practice Providers (APPs -  Physician Assistants and Nurse Practitioners) who all work together to provide you with the care you need, when you need it.  Your next appointment:   AS NEEDED   Provider:   Chrystie Nose, MD     Postural Orthostatic Tachycardia Syndrome Postural orthostatic tachycardia syndrome (POTS) is a group of symptoms that occur along with an increase in heart rate when a person stands up after lying down. The symptoms include light-headedness or fainting, and they improve when the person lies back down. POTS may be  associated with another medical condition, or it may occur on its own. What are the causes? The cause of this condition is not known, but many conditions and diseases are associated with it. What increases the risk? This condition is more likely to develop in: Women 20-65 years old. Women who are pregnant. Women who are in their period (menstruating). People who have certain conditions, such as: Infection from a virus. Diseases that cause the body's defense system (immune system) to attack healthy organs. These are called autoimmune diseases. Losing a lot of red blood cells (anemia). Losing too much water in the body (dehydration). An overactive thyroid (hyperthyroidism). People who take certain medicines. People who have had a major injury. People who have had surgery. What are the signs or symptoms? The most common symptom of this condition is light-headedness when you stand up from a lying or sitting position. Other symptoms may include: Feeling a rapid increase in the heartbeat (tachycardia) within 10 minutes of standing up. Chest pain. Shortness of breath. Breathing that is deeper and faster than normal (hyperventilation). Fainting. Confusion. Trembling. Weakness. Headache. Anxiety. Nausea. Sweating or flushing. Symptoms may be worse in the morning, and they may be relieved by lying down. How is this diagnosed? This condition is diagnosed based on: Your symptoms. Your medical history. A physical exam. Checking your heart rate when you are lying down and after you stand up. Checking your blood pressure when you go from lying down to standing up. Blood and urine tests to measure hormones that change with blood pressure. The blood  tests will be done when you are lying down and when you are standing up. You may have other tests to check for conditions or diseases that are associated with POTS. How is this treated? Treatment for this condition depends on how severe your  symptoms are and whether you have any conditions or diseases that are associated with POTS. Treatment may involve: Treating any conditions or diseases that are associated with POTS. Drinking two glasses of water before getting up from a lying position. Increasing salt (sodium) in your diet. Taking medicine to control blood pressure and heart rate (beta-blocker). Avoiding certain medicines. Starting an exercise program under the supervision of a health care provider. Follow these instructions at home: Medicines Take over-the-counter and prescription medicines only as told by your health care provider. Let your health care provider know about all prescription or over-the-counter medicines you take. These include herbs, vitamins, and supplements. You may need to stop or adjust some medicines if they cause this condition. Talk with your health care provider before starting any new medicines. Eating and drinking  Drink enough fluid to keep your urine pale yellow. If told by your health care provider, drink two glasses of water before getting up from a lying position. Follow instructions from your health care provider about how much sodium you should include in your diet. Avoid heavy meals. Eat several small meals a day instead of a few large meals. General instructions Do an aerobic exercise for 20 minutes a day, at least 3 days a week. Aerobic exercises are those that cause your heart to beat faster. Ask your health care provider what kinds of exercise are safe for you. Do not use any products that contain nicotine or tobacco. These products include cigarettes, chewing tobacco, and vaping devices, such as e-cigarettes. These can interfere with blood flow. If you need help quitting, ask your health care provider. Keep all follow-up visits. This is important. Contact a health care provider if: Your symptoms do not improve after treatment. Your symptoms get worse. You develop new symptoms. Get  help right away if: You have chest pain. You have difficulty breathing. You have fainting episodes. These symptoms may be an emergency. Get help right away. Call 911. Do not wait to see if the symptoms will go away. Do not drive yourself to the hospital. Summary POTS is a group of symptoms that occur along with an increase in heart rate when a person stands up after lying down. The most common symptom is light-headedness when you stand up. Treatment for this condition includes treating any underlying conditions, drinking plenty of water, stopping or changing some medicines, or starting an exercise program. Get help right away if you have chest pain, difficulty breathing, or fainting episodes. These symptoms may be an emergency. This information is not intended to replace advice given to you by your health care provider. Make sure you discuss any questions you have with your health care provider. Document Revised: 03/13/2021 Document Reviewed: 03/13/2021 Elsevier Patient Education  2023 [employer].

## 2022-12-28 NOTE — Progress Notes (Signed)
OFFICE CONSULT NOTE  Chief Complaint:  Palpitations/tachycardia  Primary Care Physician: Patient, No Pcp Per  HPI:  Lee Ingram is a 22 y.o. male who is being seen today for the evaluation of palpitations/tachycardia at the request of Ingram, Lee Barthel, NP. This is a pleasant 22 year old male kindly referred for evaluation of palpitations and tachycardia.  He reports recently he has been having some worsening palpitations and elevated heart rate, particularly when waking up.  He has been in the emergency department for nonspecific chest pain and abdominal pain in the past.  He saw his primary care provider who noted that he has been having these episodes of heart racing.  Workup has generally been negative.  He also does some flooring/remodeling work and has been symptomatic particular when bending over and picking up things and sitting up quickly.  His blood pressure was noted to be low today 98/72 and he was not aware as to whether it runs low typically.  EKG shows a sinus tachycardia at 103 with right atrial enlargement and right axis deviation consistent with pulmonary disease pattern.  He has no known pulmonary issues and denies any snoring, nonrestorative sleep or witnessed apnea.  He does not note any history of heart disease in the family.  PMHx:  Past Medical History:  Diagnosis Date   ADHD    Auditory processing disorder     Past Surgical History:  Procedure Laterality Date   BIOPSY  04/24/2022   Procedure: BIOPSY;  Surgeon: Lee Frame, MD;  Location: [facility];  Service: Gastroenterology;;   ESOPHAGOGASTRODUODENOSCOPY (EGD) WITH PROPOFOL N/A 04/24/2022   Procedure: ESOPHAGOGASTRODUODENOSCOPY (EGD) WITH PROPOFOL;  Surgeon: Lee Frame, MD;  Location: [facility];  Service: Gastroenterology;  Laterality: N/A;  145 ASA 1    FAMHx:  No family history on file.  No history of heart disease or palpitations  SOCHx:   reports that he has  been smoking e-cigarettes. He has been exposed to tobacco smoke. He has never used smokeless tobacco. He reports that he does not drink alcohol and does not use drugs.  ALLERGIES:  No Known Allergies  ROS: Pertinent items noted in HPI and remainder of comprehensive ROS otherwise negative.  HOME MEDS: Current Outpatient Medications on File Prior to Visit  Medication Sig Dispense Refill   Bisacodyl (LAXATIVE PO) Take by mouth. Takes as needed     hydrOXYzine (ATARAX) 25 MG tablet Take 1 tablet (25 mg total) by mouth every 8 (eight) hours as needed for anxiety. 30 tablet 0   ibuprofen (ADVIL) 400 MG tablet Take 1 tablet (400 mg total) by mouth every 6 (six) hours as needed. 30 tablet 0   omeprazole (PRILOSEC) 40 MG capsule Take 1 capsule (40 mg total) by mouth daily. 90 capsule 3   pantoprazole (PROTONIX) 20 MG tablet Take 1 tablet (20 mg total) by mouth daily for 14 days. 14 tablet 0   No current facility-administered medications on file prior to visit.    LABS/IMAGING: No results found for this or any previous visit (from the past 48 hour(s)). No results found.  LIPID PANEL: No results found for: "CHOL", "TRIG", "HDL", "CHOLHDL", "VLDL", "LDLCALC", "LDLDIRECT"  WEIGHTS: Wt Readings from Last 3 Encounters:  12/28/22 153 lb 12.8 oz (69.8 kg)  06/25/22 145 lb (65.8 kg)  04/24/22 146 lb (66.2 kg)    VITALS: BP 98/72   Pulse (!) 103   Ht  (1.803 m)   Wt 153 lb 12.8  oz (69.8 kg)   SpO2 98%   BMI 21.45 kg/m   EXAM: General appearance: alert, no distress, and thin Neck: no carotid bruit, no JVD, and thyroid not enlarged, symmetric, no tenderness/mass/nodules Lungs: clear to auscultation bilaterally Heart: regular rate and rhythm, S1, S2 normal, no murmur, click, rub or gallop Abdomen: soft, non-tender; bowel sounds normal; no masses,  no organomegaly Extremities: extremities normal, atraumatic, no cyanosis or edema Pulses: 2+ and symmetric Skin: Skin color, texture,  turgor normal. No rashes or lesions Neurologic: Grossly normal Psych: Pleasant  EKG: Sinus tachycardia at 103, right atrial enlargement and right axis deviation- personally reviewed  ASSESSMENT: Palpitations/dizziness Presyncope Abnormal EKG  PLAN: 1.   Mr. Lee Ingram has been having palpitations and dizziness with presyncopal symptoms worse when awakening in the morning.  He notes sometimes if he drinks a lot more water the night before he goes to bed he feels better in the morning.  I suspect this is symptoms of orthostatic hypotension.  He could have some degree of POTS syndrome.  I encouraged hydration and liberalization of salt in his diet.  His EKG is abnormal suggesting pulmonary disease type pattern.  I would like to get an echocardiogram to assess for these abnormalities to see if there is an underlying reason as to why he is had tachycardia and his presyncopal symptoms.  I will contact him with those results.  Otherwise if workup is been unrevealing would consider other causes of hypotension and tachycardia besides orthostasis such as adrenal insufficiency.  He is already advised to decrease use of caffeine and stimulants in his diet which he has done.  Thanks again for the kind referral.  Lee Nose, MD, [facility]  [place]  [facility]  Medical Director of the [facility]  [facility] Diplomate of the American Board of Clinical Lipidology Attending Cardiologist  Direct Dial: [phone]  Fax: [phone]  Website:  [email or web address] Nicely Lee Ingram 12/28/2022, 1:16 PM

## 2023-01-20 ENCOUNTER — Ambulatory Visit (HOSPITAL_COMMUNITY): Payer: Medicaid Other | Attending: Internal Medicine

## 2023-01-20 DIAGNOSIS — R42 Dizziness and giddiness: Secondary | ICD-10-CM | POA: Diagnosis not present

## 2023-01-20 DIAGNOSIS — R55 Syncope and collapse: Secondary | ICD-10-CM | POA: Insufficient documentation

## 2023-01-20 LAB — ECHOCARDIOGRAM COMPLETE
Area-P 1/2: 4.64 cm2
S' Lateral: 1.9 cm

## 2023-01-21 ENCOUNTER — Telehealth: Payer: Self-pay | Admitting: Internal Medicine

## 2023-01-21 NOTE — Telephone Encounter (Signed)
Patient aware of results.

## 2023-01-21 NOTE — Telephone Encounter (Signed)
Pt returning nurses call regarding Echo results. Please advise 

## 2023-01-21 NOTE — Telephone Encounter (Signed)
Chrystie Nose, MD  Lindell Spar, RN Normal LVEF 60-65%. Normal diastolic function. Normal study.  Dr Rexene Edison

## 2023-02-13 ENCOUNTER — Emergency Department (HOSPITAL_BASED_OUTPATIENT_CLINIC_OR_DEPARTMENT_OTHER)
Admission: EM | Admit: 2023-02-13 | Discharge: 2023-02-13 | Disposition: A | Discharge status: Home / Self Care | Payer: Medicaid Other | Attending: Emergency Medicine | Admitting: Emergency Medicine

## 2023-02-13 ENCOUNTER — Encounter (HOSPITAL_BASED_OUTPATIENT_CLINIC_OR_DEPARTMENT_OTHER): Payer: Self-pay | Admitting: Emergency Medicine

## 2023-02-13 ENCOUNTER — Other Ambulatory Visit: Payer: Self-pay

## 2023-02-13 ENCOUNTER — Emergency Department (HOSPITAL_BASED_OUTPATIENT_CLINIC_OR_DEPARTMENT_OTHER): Payer: Medicaid Other

## 2023-02-13 DIAGNOSIS — R1030 Lower abdominal pain, unspecified: Secondary | ICD-10-CM | POA: Diagnosis not present

## 2023-02-13 DIAGNOSIS — R109 Unspecified abdominal pain: Secondary | ICD-10-CM | POA: Diagnosis present

## 2023-02-13 LAB — CBC
HCT: 45.2 % (ref 39.0–52.0)
Hemoglobin: 15.6 g/dL (ref 13.0–17.0)
MCH: 29.6 pg (ref 26.0–34.0)
MCHC: 34.5 g/dL (ref 30.0–36.0)
MCV: 85.8 fL (ref 80.0–100.0)
Platelets: 233 10*3/uL (ref 150–400)
RBC: 5.27 MIL/uL (ref 4.22–5.81)
RDW: 12.3 % (ref 11.5–15.5)
WBC: 6 10*3/uL (ref 4.0–10.5)
nRBC: 0 % (ref 0.0–0.2)

## 2023-02-13 LAB — LIPASE, BLOOD: Lipase: 36 U/L (ref 11–51)

## 2023-02-13 LAB — COMPREHENSIVE METABOLIC PANEL
ALT: 23 U/L (ref 0–44)
AST: 22 U/L (ref 15–41)
Albumin: 5 g/dL (ref 3.5–5.0)
Alkaline Phosphatase: 83 U/L (ref 38–126)
Anion gap: 7 (ref 5–15)
BUN: 18 mg/dL (ref 6–20)
CO2: 29 mmol/L (ref 22–32)
Calcium: 9.7 mg/dL (ref 8.9–10.3)
Chloride: 104 mmol/L (ref 98–111)
Creatinine, Ser: 1.12 mg/dL (ref 0.61–1.24)
GFR, Estimated: >60 mL/min (ref >60)
Glucose, Bld: 128 mg/dL — ABNORMAL HIGH (ref 70–99)
Potassium: 3.8 mmol/L (ref 3.5–5.1)
Sodium: 140 mmol/L (ref 135–145)
Total Bilirubin: 0.5 mg/dL (ref 0.3–1.2)
Total Protein: 7.7 g/dL (ref 6.5–8.1)

## 2023-02-13 LAB — URINALYSIS, ROUTINE W REFLEX MICROSCOPIC
Bilirubin Urine: NEGATIVE
Glucose, UA: NEGATIVE mg/dL
Hgb urine dipstick: NEGATIVE
Ketones, ur: NEGATIVE mg/dL
Leukocytes,Ua: NEGATIVE
Nitrite: NEGATIVE
Protein, ur: NEGATIVE mg/dL
Specific Gravity, Urine: 1.031 — ABNORMAL HIGH (ref 1.005–1.030)
pH: 5.5 (ref 5.0–8.0)

## 2023-02-13 MED ORDER — FENTANYL CITRATE PF 50 MCG/ML IJ SOSY
50.0000 ug | PREFILLED_SYRINGE | Freq: Once | INTRAMUSCULAR | Status: AC
  Administered 2023-02-13: 50 ug via INTRAVENOUS
  Filled 2023-02-13: qty 1

## 2023-02-13 MED ORDER — IOHEXOL 300 MG/ML  SOLN
100.0000 mL | Freq: Once | INTRAMUSCULAR | Status: AC | PRN
  Administered 2023-02-13: 100 mL via INTRAVENOUS

## 2023-02-13 NOTE — Discharge Instructions (Signed)
We saw you in the ER for abdominal pain. All the results in the ER are normal, labs and imaging. We are not sure what is causing your symptoms. The workup in the ER is not complete, and is limited to screening for life threatening and emergent conditions only, so please see a primary care doctor for further evaluation.  

## 2023-02-13 NOTE — ED Provider Notes (Signed)
[place] EMERGENCY DEPARTMENT AT [facility] Provider Note   CSN: [id number] Arrival date & time: 02/13/23  1950     History {Add pertinent medical, surgical, social history, OB history to HPI:1} Chief Complaint  Patient presents with   Abdominal Pain    Lee Ingram is a 22 y.o. male.  HPI    Pt comes in with  cc of abd pain.  Home Medications Prior to Admission medications   Medication Sig Start Date End Date Taking? Authorizing Provider  Bisacodyl (LAXATIVE PO) Take by mouth. Takes as needed    [provider]  hydrOXYzine (ATARAX) 25 MG tablet Take 1 tablet (25 mg total) by mouth every 8 (eight) hours as needed for anxiety. 12/18/21   Kommor, Madison, MD  ibuprofen (ADVIL) 400 MG tablet Take 1 tablet (400 mg total) by mouth every 6 (six) hours as needed. 06/25/22   Benjiman Core, MD  omeprazole (PRILOSEC) 40 MG capsule Take 1 capsule (40 mg total) by mouth daily. 04/24/22   Dolores Frame, MD  pantoprazole (PROTONIX) 20 MG tablet Take 1 tablet (20 mg total) by mouth daily for 14 days. 06/25/22 07/09/22  Benjiman Core, MD      Allergies    Patient has no known allergies.    Review of Systems   Review of Systems  Physical Exam Updated Vital Signs BP 127/85   Pulse 98   Temp 98.4 F (36.9 C) (Oral)   Resp 19   SpO2 99%  Physical Exam  ED Results / Procedures / Treatments   Labs (all labs ordered are listed, but only abnormal results are displayed) Labs Reviewed  COMPREHENSIVE METABOLIC PANEL - Abnormal; Notable for the following components:      Result Value   Glucose, Bld 128 (*)    All other components within normal limits  URINALYSIS, ROUTINE W REFLEX MICROSCOPIC - Abnormal; Notable for the following components:   Specific Gravity, Urine 1.031 (*)    All other components within normal limits  LIPASE, BLOOD  CBC    EKG None  Radiology CT ABDOMEN PELVIS W CONTRAST  Result Date: 02/13/2023 CLINICAL DATA:   Right lower quadrant abdominal pain EXAM: CT ABDOMEN AND PELVIS WITH CONTRAST TECHNIQUE: Multidetector CT imaging of the abdomen and pelvis was performed using the standard protocol following bolus administration of intravenous contrast. RADIATION DOSE REDUCTION: This exam was performed according to the departmental dose-optimization program which includes automated exposure control, adjustment of the mA and/or kV according to patient size and/or use of iterative reconstruction technique. CONTRAST:  OMNIPAQUE IOHEXOL 300 MG/ML  SOLN COMPARISON:  CT abdomen and pelvis 12/06/2021 FINDINGS: Lower chest: No acute abnormality. Hepatobiliary: No focal liver abnormality is seen. No gallstones, gallbladder wall thickening, or biliary dilatation. Pancreas: Unremarkable. No pancreatic ductal dilatation or surrounding inflammatory changes. Spleen: Normal in size without focal abnormality. Adrenals/Urinary Tract: Adrenal glands are unremarkable. Kidneys are normal, without renal calculi, focal lesion, or hydronephrosis. Bladder is unremarkable. Stomach/Bowel: Stomach is within normal limits. Appendix appears normal. No evidence of bowel wall thickening, distention, or inflammatory changes. There is sigmoid colon diverticulosis. Vascular/Lymphatic: No significant vascular findings are present. No enlarged abdominal or pelvic lymph nodes. Reproductive: Prostate is unremarkable. Other: No abdominal wall hernia or abnormality. No abdominopelvic ascites. Musculoskeletal: No acute or significant osseous findings. IMPRESSION: 1. No acute localizing process in the abdomen or pelvis. 2. Normal appendix. 3. Sigmoid colon diverticulosis. Electronically Signed   By: Darliss Cheney M.D.   On: 02/13/2023 22:06  Procedures Procedures  {Document cardiac monitor, telemetry assessment procedure when appropriate:1}  Medications Ordered in ED Medications  fentaNYL (SUBLIMAZE) injection 50 mcg (50 mcg Intravenous Given 02/13/23 2121)   iohexol (OMNIPAQUE) 300 MG/ML solution 100 mL (100 mLs Intravenous Contrast Given 02/13/23 2145)    ED Course/ Medical Decision Making/ A&P   {   Click here for ABCD2, HEART and other calculatorsREFRESH Note before signing :1}                          Medical Decision Making Amount and/or Complexity of Data Reviewed Labs: ordered. Radiology: ordered.  Risk Prescription drug management.   ***  {Document critical care time when appropriate:1} {Document review of labs and clinical decision tools ie heart score, Chads2Vasc2 etc:1}  {Document your independent review of radiology images, and any outside records:1} {Document your discussion with family members, caretakers, and with consultants:1} {Document social determinants of health affecting pt's care:1} {Document your decision making why or why not admission, treatments were needed:1} Final Clinical Impression(s) / ED Diagnoses Final diagnoses:  Lower abdominal pain    Rx / DC Orders ED Discharge Orders     None

## 2023-02-13 NOTE — ED Triage Notes (Signed)
Right side abd pain, started this afternoon.  On and off

## 2023-05-21 IMAGING — CT CT RENAL STONE PROTOCOL
2 of 4 series · 15 of 46 positions shown, 17 images · non-contrast
Comparison: CT with IV contrast 01/22/2021

CLINICAL DATA: Right flank and right lower quadrant pain.



[Series 2: axial st · axial · 0.75mm/px · z∈[+819,+1259]mm · 12 of 100 slices shown, 14 images]
[im 6/100  soft-tissue]
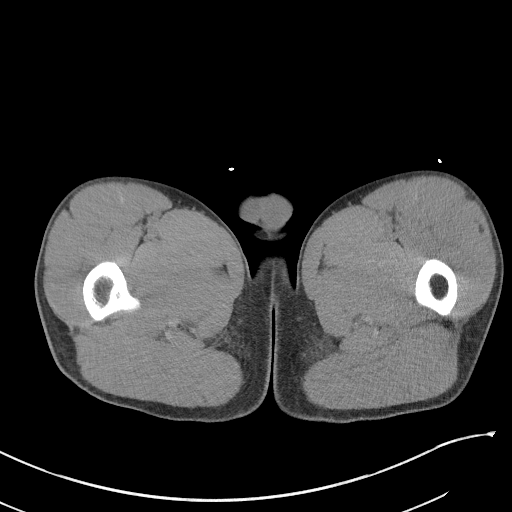
[im 6/100  bone]
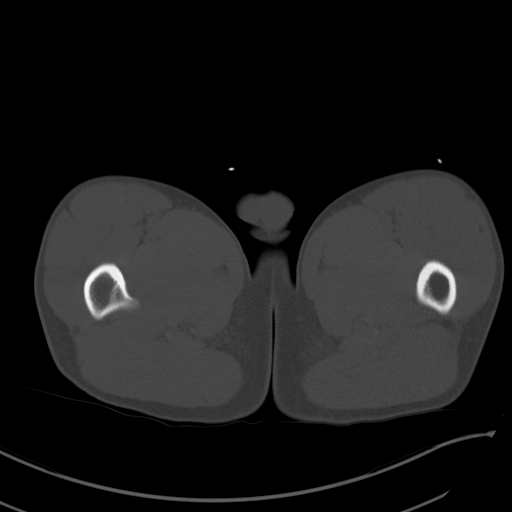
[im 18/100  soft-tissue]
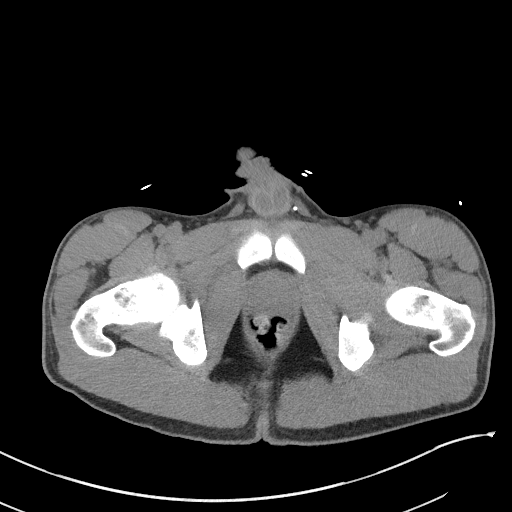
[im 24/100  soft-tissue]
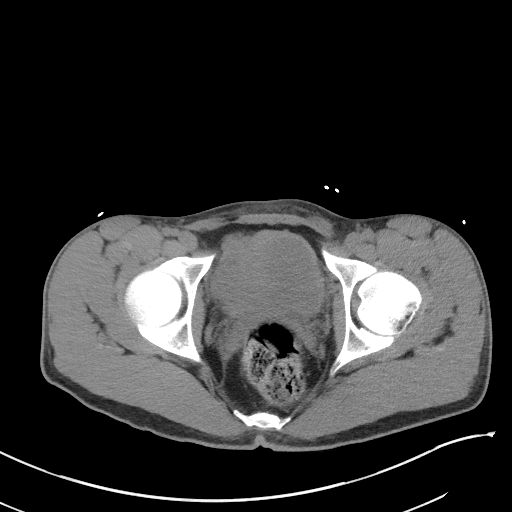
[im 30/100  soft-tissue]
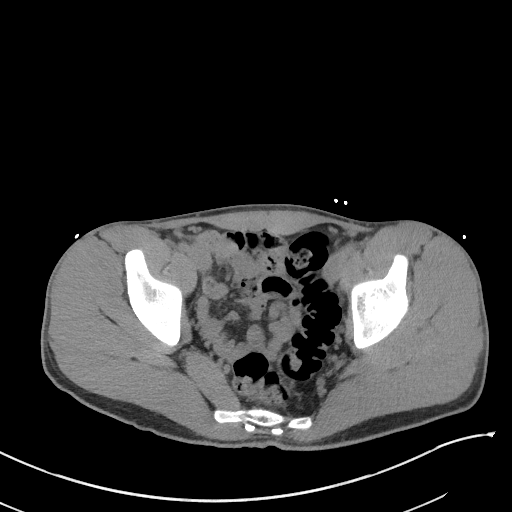
[im 41/100  soft-tissue]
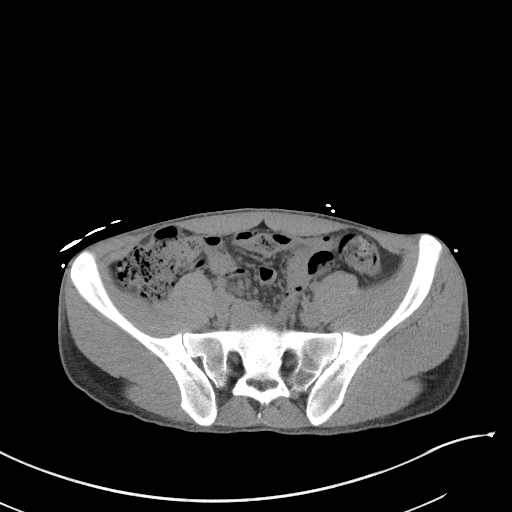
[im 47/100  soft-tissue]
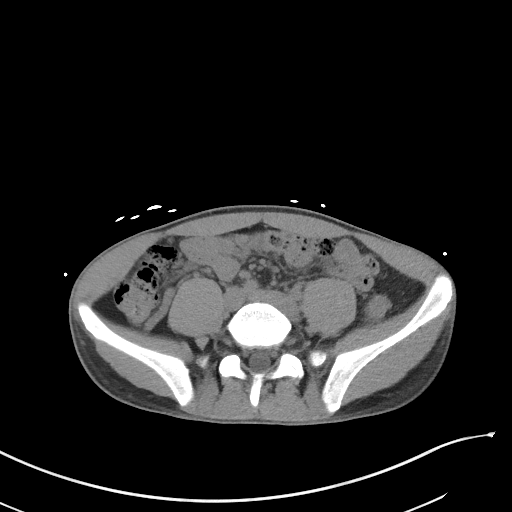
[im 53/100  soft-tissue]
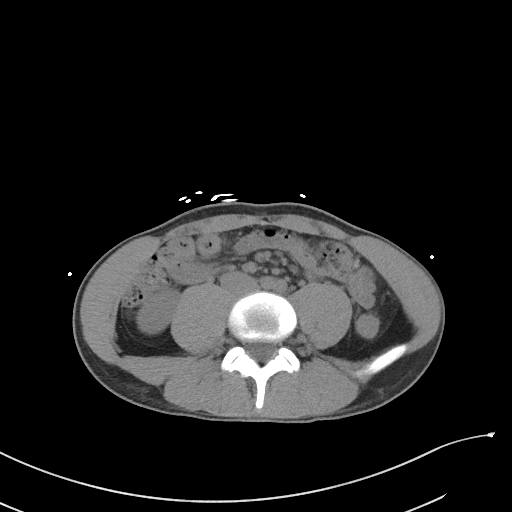
[im 65/100  soft-tissue]
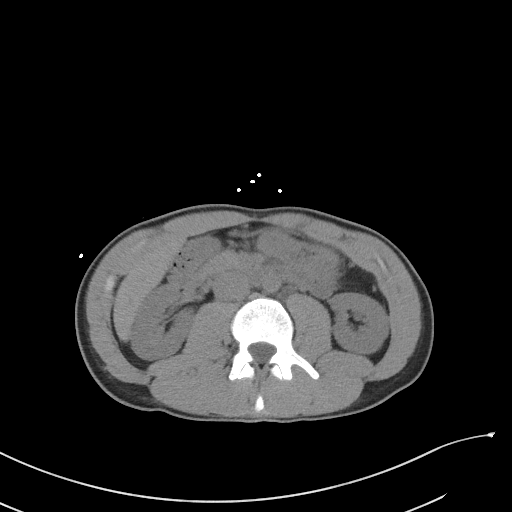
[im 70/100  soft-tissue]
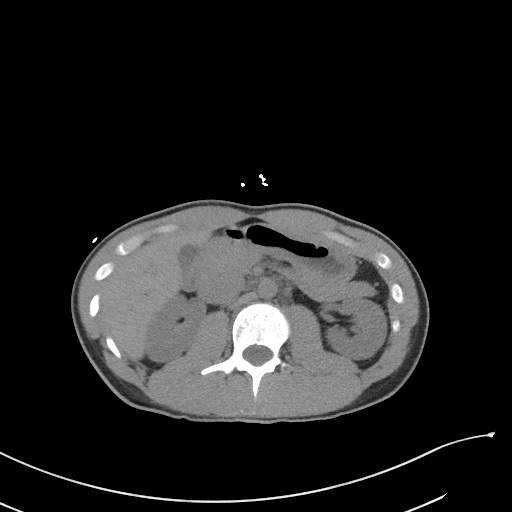
[im 70/100  bone]
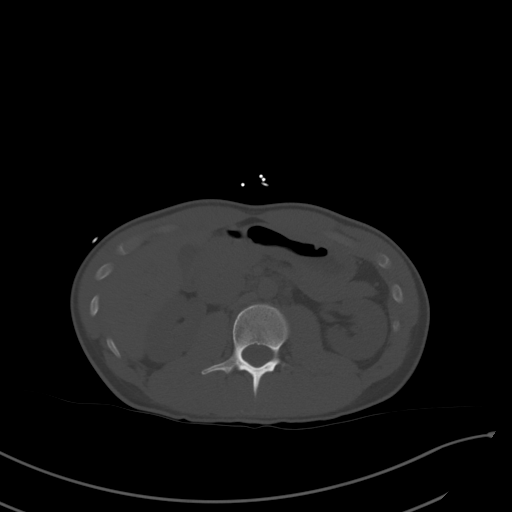
[im 76/100  soft-tissue]
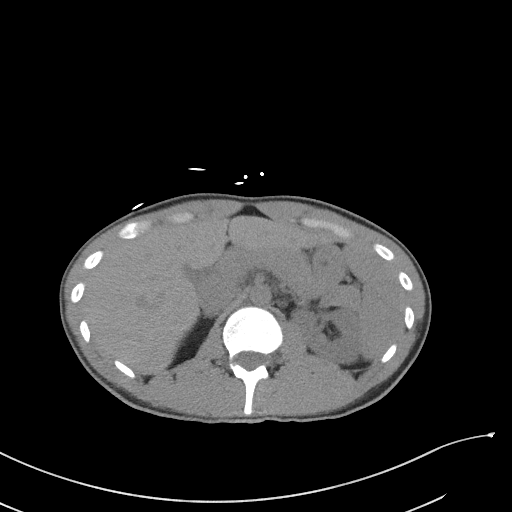
[im 88/100  soft-tissue]
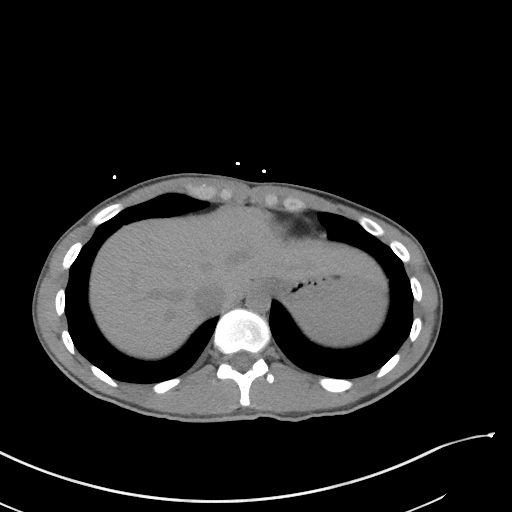
[im 94/100  soft-tissue]
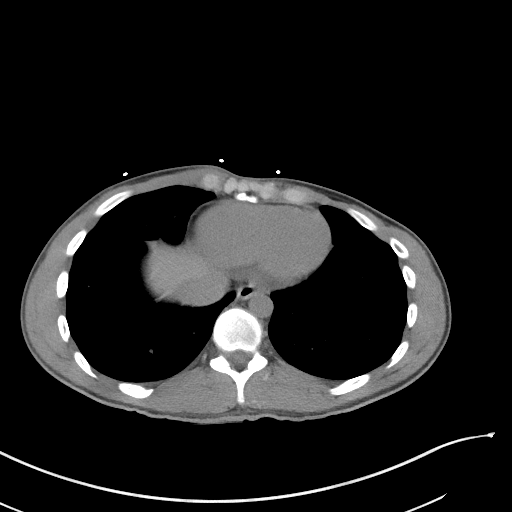

[Series 5: coronal st · coronal · 0.66mm/px · 3 of 78 slices shown]
[im 26/78  soft-tissue]
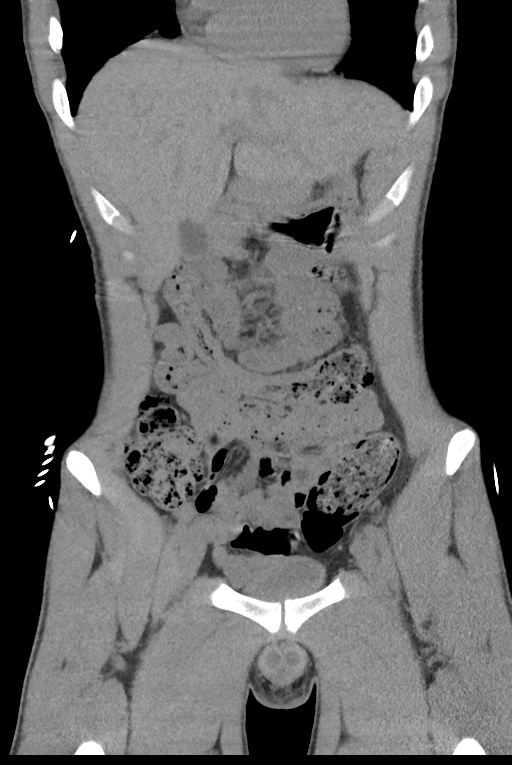
[im 35/78  soft-tissue]
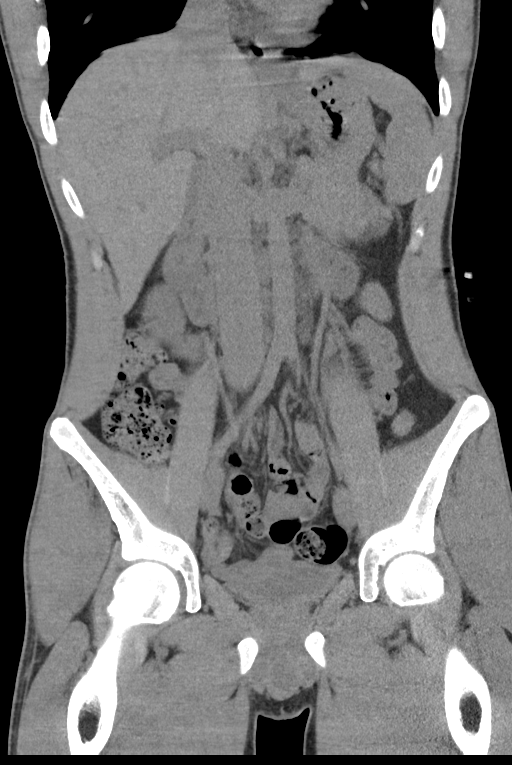
[im 43/78  soft-tissue]
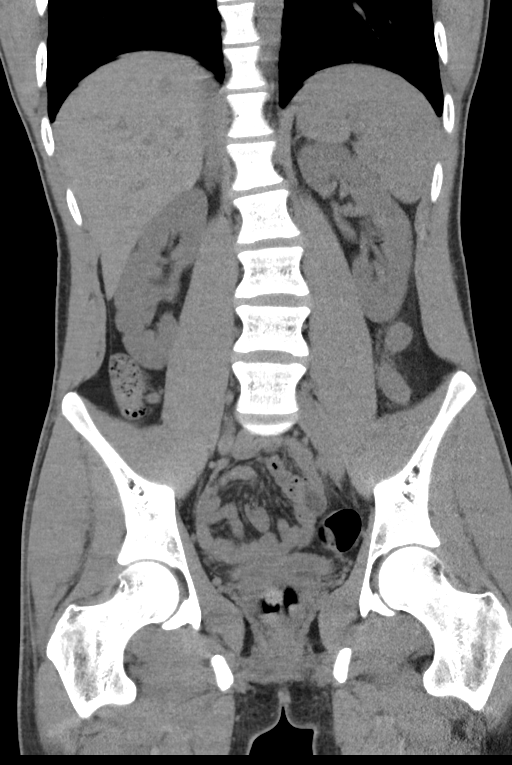

[15 of 46 positions shown; findings below may reference images not displayed]

FINDINGS: Lower chest: No acute abnormality.

Hepatobiliary: No focal liver abnormality is seen . No gallstones,
gallbladder wall thickening, or biliary dilatation. Without contrast

Pancreas: Unremarkable without contrast.

Spleen: Normal in size and unremarkable without contrast.

Adrenals/Urinary Tract: There is no adrenal mass. There is no
evidence of urinary stones or obstruction, no focal abnormality in
the unenhanced renal cortex is seen. The bladder is contracted and
poorly visualized. There is no perivesical edema.

Stomach/Bowel: There are thickened folds in the stomach and the left
upper to mid abdominal small bowel consistent with gastroenteritis
without evidence of small-bowel obstruction or inflammatory change.

The appendix is normal. There is moderate retained stool in the
ascending colon. There is wall thickening or nondistention in the
descending segment. There are sigmoid diverticula without evidence
of diverticulitis, and additional moderate dense stool in the
rectum.

Vascular/Lymphatic: No significant vascular findings are present. No
enlarged abdominal or pelvic lymph nodes.

Reproductive: Normal prostate size and shape. Both testicles are in
the scrotal sac and no hydrocele is seen.

Other: There is no free air, hemorrhage or fluid. There is no
incarcerated hernia.

Musculoskeletal: Mild lumbar levoscoliosis. No acute or significant
osseous findings.
IMPRESSION: 1. Gastroenteritis without evidence of small-bowel obstruction or
inflammation.
2. Descending colitis versus nondistention.
3. No evidence of urinary stones or obstruction.
4. Constipation ascending colon, rectum.

## 2023-05-22 ENCOUNTER — Encounter (HOSPITAL_COMMUNITY): Payer: Self-pay

## 2023-05-22 ENCOUNTER — Emergency Department (HOSPITAL_COMMUNITY): Payer: Medicaid Other

## 2023-05-22 ENCOUNTER — Emergency Department (HOSPITAL_COMMUNITY)
Admission: EM | Admit: 2023-05-22 | Discharge: 2023-05-22 | Disposition: A | Discharge status: Skilled Nursing Facility | Payer: Medicaid Other | Attending: Emergency Medicine | Admitting: Emergency Medicine

## 2023-05-22 DIAGNOSIS — J029 Acute pharyngitis, unspecified: Secondary | ICD-10-CM | POA: Diagnosis present

## 2023-05-22 DIAGNOSIS — R070 Pain in throat: Secondary | ICD-10-CM

## 2023-05-22 LAB — GROUP A STREP BY PCR: Group A Strep by PCR: NOT DETECTED

## 2023-05-22 NOTE — Discharge Instructions (Signed)
Your xray throat swab and EKG are normal today and reassuring.  I recommend taking your prilosec if you are not already still taking this medicine.

## 2023-05-22 NOTE — ED Provider Notes (Signed)
[place] EMERGENCY DEPARTMENT AT [facility] Provider Note   CSN: [id number] Arrival date & time: 05/22/23  1509     History {Add pertinent medical, surgical, social history, OB history to HPI:1} Chief Complaint  Patient presents with  . Sore Throat    Lee Ingram is a 22 y.o. male with a history of ADHD presenting with a 2-day history of intermittent pressure in the back of his throat, describing a full sensation causing him to want to near gag, but denies painful swallowing.  He has been able to eat and drink, denies nausea or vomiting, he does state he has had several episodes where his jaws and the tip of his tongue have gone numb with these episodes.  He denies dental pain or swelling of his gums or posterior throat.  He denies cough, choking, fevers or chills.  He does have some intermittent pressure sensation in his chest also that radiates into his and back in association with the throat tightness.  He has had no alleviators for symptoms.  Review of chart, pt seen by Dr. Rennis Golden 4/24 for am lightheadedness, tachycardia, felt either orthostatic vs mild POTS.  Pt changed his diet, increased fluid intake and sodium,  sx have resolved.  The history is provided by the patient and medical records.       Home Medications Prior to Admission medications   Medication Sig Start Date End Date Taking? Authorizing Provider  Bisacodyl (LAXATIVE PO) Take by mouth. Takes as needed    [provider]  hydrOXYzine (ATARAX) 25 MG tablet Take 1 tablet (25 mg total) by mouth every 8 (eight) hours as needed for anxiety. 12/18/21   Kommor, Madison, MD  ibuprofen (ADVIL) 400 MG tablet Take 1 tablet (400 mg total) by mouth every 6 (six) hours as needed. 06/25/22   Benjiman Core, MD  omeprazole (PRILOSEC) 40 MG capsule Take 1 capsule (40 mg total) by mouth daily. 04/24/22   Dolores Frame, MD  pantoprazole (PROTONIX) 20 MG tablet Take 1 tablet (20 mg total) by mouth  daily for 14 days. 06/25/22 07/09/22  Benjiman Core, MD      Allergies    Patient has no known allergies.    Review of Systems   Review of Systems  Constitutional:  Negative for chills and fever.  HENT:  Negative for congestion, ear pain, rhinorrhea, sinus pressure, sore throat, trouble swallowing and voice change.        Negative except as mentioned in HPI.    Eyes:  Negative for discharge.  Respiratory:  Negative for cough, shortness of breath, wheezing and stridor.   Cardiovascular:  Negative for chest pain.  Gastrointestinal:  Negative for abdominal pain, nausea and vomiting.  Genitourinary: Negative.     Physical Exam Updated Vital Signs BP (!) 127/95   Pulse 82   Temp 98.6 F (37 C) (Oral)   Resp 18   Ht 5\' 11"  (1.803 m)   Wt 68 kg   SpO2 100%   BMI 20.92 kg/m  Physical Exam Vitals and nursing note reviewed.  Constitutional:      Appearance: He is well-developed.  HENT:     Head: Normocephalic and atraumatic.     Nose: No congestion.     Mouth/Throat:     Pharynx: Posterior oropharyngeal erythema present.     Tonsils: No tonsillar abscesses.  Eyes:     Conjunctiva/sclera: Conjunctivae normal.  Neck:     Thyroid: No thyromegaly.  Cardiovascular:  Rate and Rhythm: Normal rate and regular rhythm.     Heart sounds: Normal heart sounds.  Pulmonary:     Effort: Pulmonary effort is normal.     Breath sounds: Normal breath sounds. No wheezing.  Abdominal:     General: Bowel sounds are normal.     Palpations: Abdomen is soft.     Tenderness: There is no abdominal tenderness.  Musculoskeletal:        General: Normal range of motion.     Cervical back: Normal range of motion.  Lymphadenopathy:     Cervical: No cervical adenopathy.  Skin:    General: Skin is warm and dry.  Neurological:     Mental Status: He is alert.    ED Results / Procedures / Treatments   Labs (all labs ordered are listed, but only abnormal results are displayed) Labs  Reviewed  GROUP A STREP BY PCR    EKG None  Radiology No results found.  Procedures Procedures  {Document cardiac monitor, telemetry assessment procedure when appropriate:1}  Medications Ordered in ED Medications - No data to display  ED Course/ Medical Decision Making/ A&P   {   Click here for ABCD2, HEART and other calculatorsREFRESH Note before signing :1}                              Medical Decision Making Amount and/or Complexity of Data Reviewed Radiology: ordered.     {Document critical care time when appropriate:1} {Document review of labs and clinical decision tools ie heart score, Chads2Vasc2 etc:1}  {Document your independent review of radiology images, and any outside records:1} {Document your discussion with family members, caretakers, and with consultants:1} {Document social determinants of health affecting pt's care:1} {Document your decision making why or why not admission, treatments were needed:1} Final Clinical Impression(s) / ED Diagnoses Final diagnoses:  None    Rx / DC Orders ED Discharge Orders     None

## 2023-05-22 NOTE — ED Notes (Signed)
Pt has some white patches in the back of his throat, with redness. Pt states he has pain when he swallows.

## 2023-05-22 NOTE — ED Triage Notes (Signed)
"  Feels like I have pressure in my throat and it went numb, also my back and chest hurts on and off" per pt

## 2023-07-01 IMAGING — DX DG CHEST 2V
2 series · 2 of 2 positions shown · non-contrast
Comparison: None Available.

CLINICAL DATA: Pain.

EXAM:
CHEST - 2 VIEW

[chest pa]
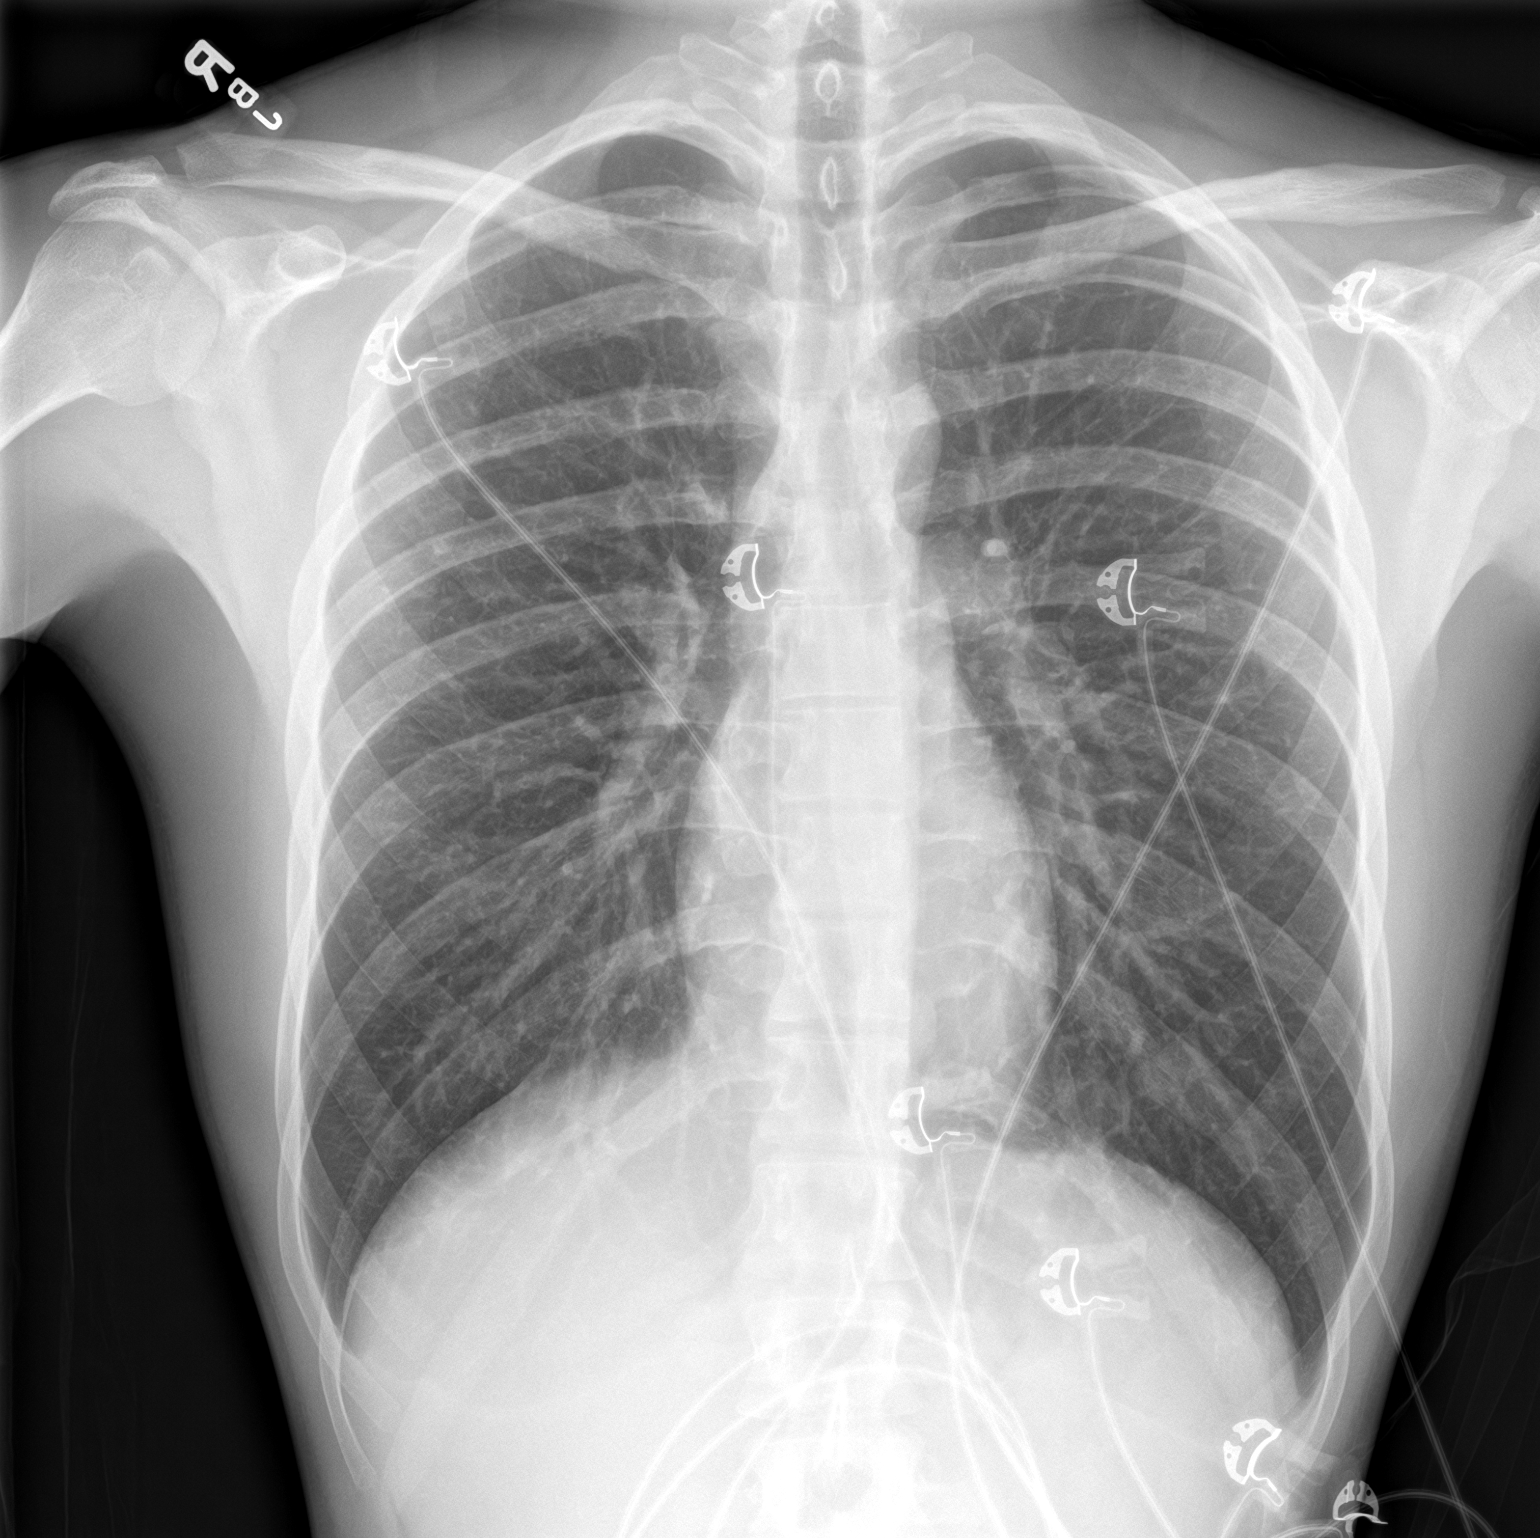

[chest lat]
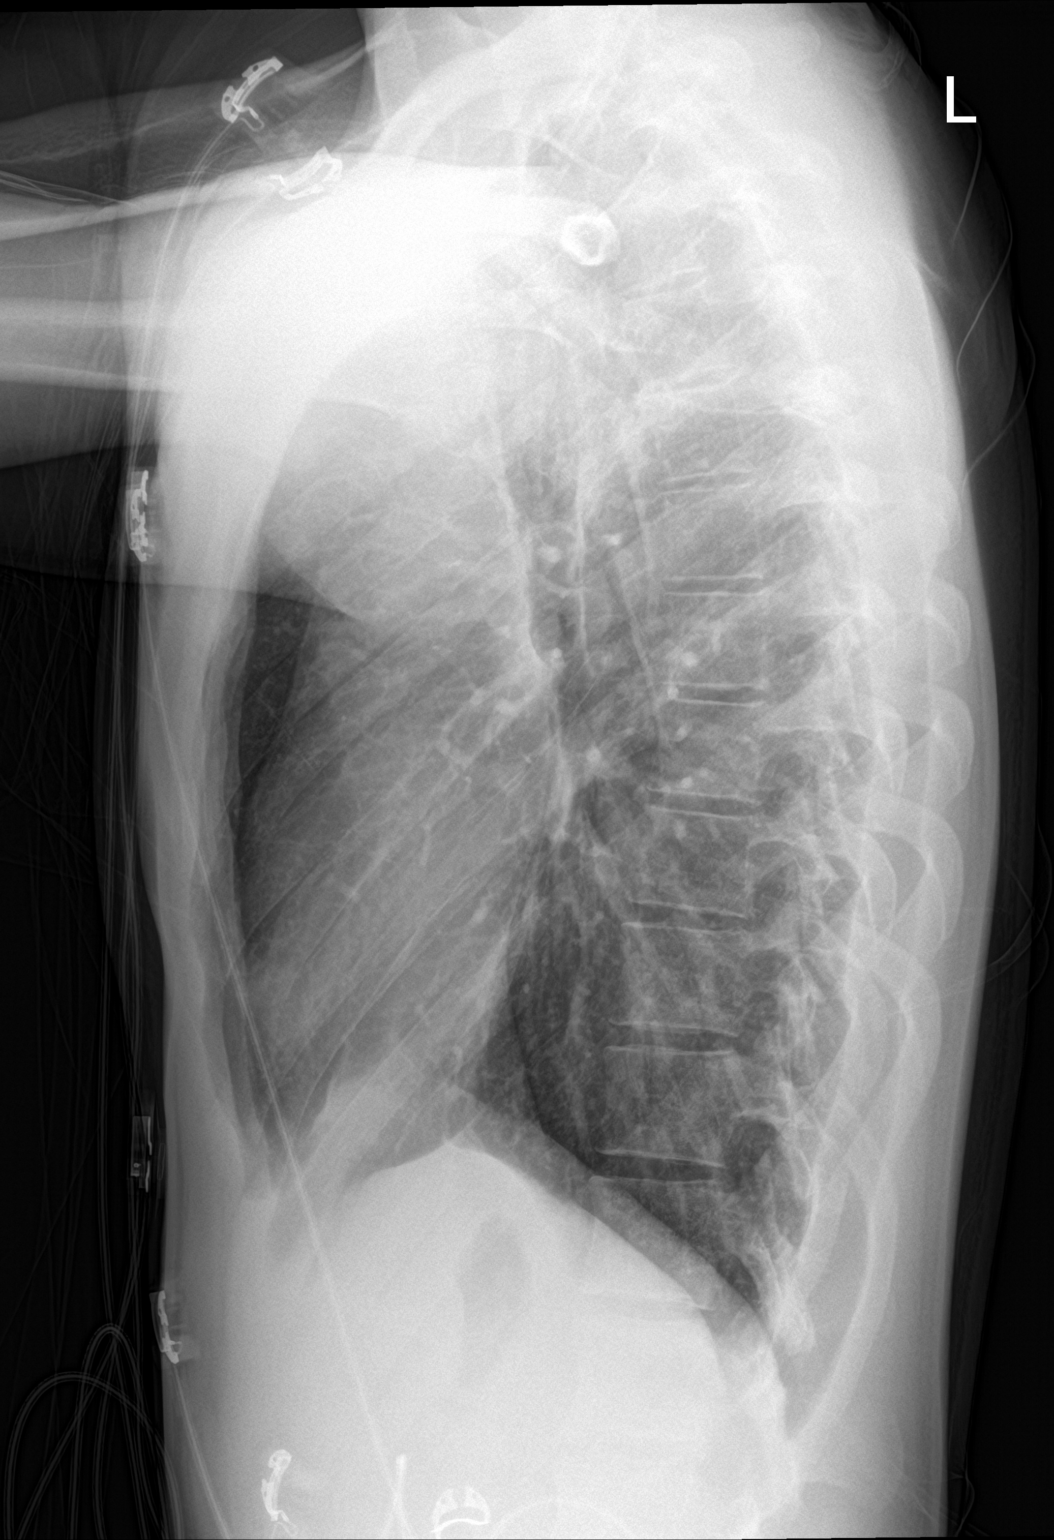

[2 of 2 positions shown; findings below may reference images not displayed]

FINDINGS: The heart size and mediastinal contours are within normal limits.
Both lungs are clear. The visualized skeletal structures are
unremarkable.
IMPRESSION: No active cardiopulmonary disease.

## 2024-08-11 ENCOUNTER — Encounter (HOSPITAL_COMMUNITY): Payer: Self-pay | Admitting: *Deleted

## 2024-08-11 ENCOUNTER — Emergency Department (HOSPITAL_COMMUNITY): Payer: Self-pay

## 2024-08-11 ENCOUNTER — Emergency Department (HOSPITAL_COMMUNITY)
Admission: EM | Admit: 2024-08-11 | Discharge: 2024-08-11 | Disposition: A | Discharge status: Home / Self Care | Payer: Self-pay | Attending: Emergency Medicine | Admitting: Emergency Medicine

## 2024-08-11 ENCOUNTER — Other Ambulatory Visit: Payer: Self-pay

## 2024-08-11 DIAGNOSIS — R000 Tachycardia, unspecified: Secondary | ICD-10-CM | POA: Insufficient documentation

## 2024-08-11 DIAGNOSIS — R058 Other specified cough: Secondary | ICD-10-CM | POA: Insufficient documentation

## 2024-08-11 DIAGNOSIS — R0602 Shortness of breath: Secondary | ICD-10-CM | POA: Insufficient documentation

## 2024-08-11 DIAGNOSIS — R0789 Other chest pain: Secondary | ICD-10-CM | POA: Insufficient documentation

## 2024-08-11 DIAGNOSIS — R0982 Postnasal drip: Secondary | ICD-10-CM | POA: Insufficient documentation

## 2024-08-11 LAB — BASIC METABOLIC PANEL WITH GFR
Anion gap: 9 (ref 5–15)
BUN: 17 mg/dL (ref 6–20)
CO2: 28 mmol/L (ref 22–32)
Calcium: 9.9 mg/dL (ref 8.9–10.3)
Chloride: 101 mmol/L (ref 98–111)
Creatinine, Ser: 0.86 mg/dL (ref 0.61–1.24)
GFR, Estimated: >60 mL/min (ref >60)
Glucose, Bld: 115 mg/dL — ABNORMAL HIGH (ref 70–99)
Potassium: 4.5 mmol/L (ref 3.5–5.1)
Sodium: 138 mmol/L (ref 135–145)

## 2024-08-11 LAB — CBC WITH DIFFERENTIAL/PLATELET
Abs Immature Granulocytes: 0.03 K/uL (ref 0.00–0.07)
Basophils Absolute: 0 K/uL (ref 0.0–0.1)
Basophils Relative: 0 %
Eosinophils Absolute: 0.1 K/uL (ref 0.0–0.5)
Eosinophils Relative: 1 %
HCT: 48.3 % (ref 39.0–52.0)
Hemoglobin: 16.6 g/dL (ref 13.0–17.0)
Immature Granulocytes: 0 %
Lymphocytes Relative: 13 %
Lymphs Abs: 1.2 K/uL (ref 0.7–4.0)
MCH: 29.6 pg (ref 26.0–34.0)
MCHC: 34.4 g/dL (ref 30.0–36.0)
MCV: 86.1 fL (ref 80.0–100.0)
Monocytes Absolute: 0.5 K/uL (ref 0.1–1.0)
Monocytes Relative: 5 %
Neutro Abs: 7.6 K/uL (ref 1.7–7.7)
Neutrophils Relative %: 81 %
Platelets: 228 K/uL (ref 150–400)
RBC: 5.61 MIL/uL (ref 4.22–5.81)
RDW: 12.1 % (ref 11.5–15.5)
WBC: 9.4 K/uL (ref 4.0–10.5)
nRBC: 0 % (ref 0.0–0.2)

## 2024-08-11 LAB — TSH: TSH: 1.53 u[IU]/mL (ref 0.350–4.500)

## 2024-08-11 MED ORDER — PREDNISONE 10 MG PO TABS
ORAL_TABLET | ORAL | 0 refills | Status: AC
Start: 2024-08-11 — End: ?

## 2024-08-11 NOTE — ED Triage Notes (Addendum)
 Pt with left sided CP x 1 month that is intermittent.  Productive cough-clear, foamy x 3-4 months ago.   Pt today began to get short winded and different pain to chest while working.

## 2024-08-11 NOTE — ED Provider Notes (Signed)
 Ultrasound ED Echo  Date/Time: 08/11/2024 3:24 PM  Performed by: Cleotilde Rogue, MD Authorized by: Cleotilde Rogue, MD   Procedure details:    Indications: chest pain     Views: subxiphoid, parasternal long axis view, parasternal short axis view and apical 4 chamber view     Images: not archived     Limitations:  Acoustic shadowing and positioning Findings:    Pericardium: no pericardial effusion     LV Function: normal (>50% EF)     RV Diameter: normal   Impression:    Impression: normal    Pt with intermittent CP - not hypotensive, no JVD< no signs of tamponade - EKS always tachycardic going back years - no signs of MI, can f/u with cardiology outpt - tx for mild pericarditis   Cleotilde Rogue, MD 08/13/24 1238

## 2024-08-11 NOTE — ED Provider Notes (Signed)
 [place] EMERGENCY DEPARTMENT AT [facility] Provider Note   CSN: [id number] Arrival date & time: 08/11/24  1212     Patient presents with: Chest Pain   Lee Ingram is a 23 y.o. male with no significant past medical history presenting with a 1 month history of intermittent left chest pain described as fleeting episodes of localized pain in his left lower sternal region.  Associated with a productive cough of a clear sputum that has escalated over the past month as well.  He reports at times feeling short of breath which is worse at rest, actually states he does not have any symptoms when he is active with his job.  He also endorses intermittent palpitations.  He describes episodes of transient lightheadedness, not with standing from a seated position but more when he bends over to reach for an object for example off the floor.  He was told he might have POTS syndrome but has had no definitive diagnosis of this.  He has had these symptoms in the past was actually seen by Dr. Mona of cardiology last year, underwent echocardiogram which was normal.  He does endorse frequent cough and what feels like copious postnasal drip.  He also has a chronically right congested nostril, denies sinus pain, also no sneezing, itching or nasal rhinorrhea.  Patient no longer smokes but is exposed to some secondhand smoke.  He denies nausea, vomiting, abdominal pain.  He has had no leg pain, swelling, no calf pain.  He is noted to beShortness of breath tachycardic here, review of chart indicates this is a chronic finding.  He does state his mother has a history of hyperthyroidism.  Patient reports good appetite, no unexplained weight loss noted with diaphoresis or hot flushes.   The history is provided by the patient.       Prior to Admission medications   Medication Sig Start Date End Date Taking? Authorizing Provider  predniSONE  (DELTASONE ) 10 MG tablet 6, 5, 4, 3, 2 then 1 tablet by mouth  daily for 6 days total. 08/11/24  Yes Luie Laneve, PA-C  Bisacodyl (LAXATIVE PO) Take by mouth. Takes as needed    [provider]  hydrOXYzine  (ATARAX ) 25 MG tablet Take 1 tablet (25 mg total) by mouth every 8 (eight) hours as needed for anxiety. 12/18/21   Kommor, Madison, MD  ibuprofen  (ADVIL ) 400 MG tablet Take 1 tablet (400 mg total) by mouth every 6 (six) hours as needed. 06/25/22   Patsey Lot, MD  omeprazole  (PRILOSEC) 40 MG capsule Take 1 capsule (40 mg total) by mouth daily. 04/24/22   Eartha Angelia Sieving, MD  pantoprazole  (PROTONIX ) 20 MG tablet Take 1 tablet (20 mg total) by mouth daily for 14 days. 06/25/22 07/09/22  Patsey Lot, MD    Allergies: Patient has no known allergies.    Review of Systems  Constitutional:  Negative for fever.  HENT:  Positive for congestion and postnasal drip. Negative for sore throat.   Eyes: Negative.   Respiratory:  Positive for cough and shortness of breath. Negative for chest tightness.   Cardiovascular:  Positive for chest pain. Negative for palpitations and leg swelling.  Gastrointestinal:  Negative for abdominal pain and nausea.  Genitourinary: Negative.   Musculoskeletal:  Negative for arthralgias, joint swelling and neck pain.  Skin: Negative.  Negative for rash and wound.  Neurological:  Negative for dizziness, weakness, light-headedness, numbness and headaches.  Psychiatric/Behavioral: Negative.      Updated Vital Signs BP 116/71  Pulse 94   Temp 98.3 F (36.8 C) (Oral)   Resp 12   Ht 5' 11 (1.803 m)   Wt 79.4 kg   SpO2 96%   BMI 24.41 kg/m   Physical Exam  (all labs ordered are listed, but only abnormal results are displayed) Labs Reviewed  BASIC METABOLIC PANEL WITH GFR - Abnormal; Notable for the following components:      Result Value   Glucose, Bld 115 (*)    All other components within normal limits  CBC WITH DIFFERENTIAL/PLATELET  TSH    EKG: EKG Interpretation Date/Time:  Friday  August 11 2024 13:54:34 EST Ventricular Rate:  98 PR Interval:  186 QRS Duration:  90 QT Interval:  332 QTC Calculation: 424 R Axis:   142  Text Interpretation: Sinus rhythm Prominent P waves, nondiagnostic Left posterior fascicular block ST elevation suggests acute pericarditis no changes from prior Confirmed by Cleotilde Rogue ([id number]) on 08/11/2024 3:03:28 PM  Radiology: DG Chest Portable 1 View Result Date: 08/11/2024 CLINICAL DATA:  Cough and chest pain. EXAM: PORTABLE CHEST 1 VIEW COMPARISON:  05/22/2023. FINDINGS: Normal heart, mediastinum and hila. Clear lungs.  No pleural effusion or pneumothorax. Skeletal structures are unremarkable. IMPRESSION: No active disease. Electronically Signed   By: Alm Parkins M.D.   On: 08/11/2024 13:05     Procedures   Medications Ordered in the ED - No data to display                                  Medical Decision Making Patient presenting with a 1 month history of intermittent left fleeting chest pain, episodes of shortness of breath which tend to occur at rest but not when exerting.  Also describing increased postnasal drip, frequently having to cough clear sputum.  Broad differential, pneumonia, bronchitis, PE unlikely given symptoms are intermittent and given the chronicity pattern does not fit for this diagnosis, primary arrhythmia, he was found to be tachycardic here although review of his chart indicates he is persistently borderline to slightly tachycardic.  He was actually seen for similar symptoms a year ago by Dr. Mona of cardiology, echocardiogram at that time was negative, patient states there was a suggestion that he may have POTS syndrome but there is no documentation in his chart suggesting that.  He denies syncope.  His EKG today suggests a pattern consistent with pericarditis however his EKG is unchanged from prior comparisons, bedside ultrasound per Dr. Dianne documentation was negative.  He will be given a prednisone  taper to  see if this improves his symptoms however.  Referral to Dr. Mona was given.  We also discussed his chronic sinus/nasal congestion, encouraged him to consider trying a OTC such as Claritin or Zyrtec to see if this will help symptoms.  Amount and/or Complexity of Data Reviewed Labs: ordered.    Details: Labs are reassuring including a normal be met, CBC, his TSH is normal range. Radiology: ordered.    Details: Chest x-ray reviewed, no acute cardiopulmonary disease. ECG/medicine tests: ordered.    Details: Sinus rhythm ST elevation suggesting acute pericarditis, unchanged from prior EKGs.  Risk Prescription drug management.        Final diagnoses:  Atypical chest pain  Tachycardia    ED Discharge Orders          Ordered    Ambulatory referral to Cardiology        08/11/24 1719  predniSONE  (DELTASONE ) 10 MG tablet        08/11/24 1834               Birdena Clarity, PA-C 08/11/24 1837    Cleotilde Rogue, MD 08/13/24 [phone]

## 2024-08-11 NOTE — Discharge Instructions (Signed)
 Your lab tests, ultrasound and your exam today are reassuring.  However we do recommend you reach out to Dr. Mona for further evaluation of your symptoms.  Your thyroid  test is normal range.  Make sure you are staying well-hydrated, continue to avoid caffeine and other stimulants.
